# Patient Record
Sex: Female | Born: 1945 | Race: White | Hispanic: No | Marital: Married
Health system: Midwestern US, Community
[De-identification: ages and names within clinical notes are randomized; demographics above are authoritative.]

## PROBLEM LIST (undated history)

## (undated) DIAGNOSIS — Z96652 Presence of left artificial knee joint: Principal | ICD-10-CM

## (undated) DIAGNOSIS — Z96651 Presence of right artificial knee joint: Principal | ICD-10-CM

## (undated) DIAGNOSIS — M1711 Unilateral primary osteoarthritis, right knee: Principal | ICD-10-CM

---

## 2008-03-20 LAB — CBC WITH AUTOMATED DIFF
ABS. LYMPHOCYTES: 1.1 10*3/uL (ref 0.8–3.5)
ABS. MONOCYTES: 0.4 10*3/uL (ref 0–1.0)
ABS. NEUTROPHILS: 5.7 10*3/uL (ref 1.8–8.0)
BASOPHILS: 0 % (ref 0–3)
EOSINOPHILS: 0 % (ref 0–5)
HCT: 33.7 % — ABNORMAL LOW (ref 36.0–46.0)
HGB: 11.4 g/dL — ABNORMAL LOW (ref 12.0–16.0)
LYMPHOCYTES: 16 % — ABNORMAL LOW (ref 20–51)
MCH: 31.7 PG (ref 25.0–35.0)
MCHC: 34 g/dL (ref 31.0–37.0)
MCV: 93.3 FL (ref 78.0–102.0)
MONOCYTES: 5 % (ref 2–9)
MPV: 9 FL (ref 7.4–10.4)
NEUTROPHILS: 79 % — ABNORMAL HIGH (ref 42–75)
PLATELET: 186 10*3/uL (ref 130–400)
RBC: 3.61 M/uL — ABNORMAL LOW (ref 4.10–5.10)
RDW: 13.3 % (ref 11.5–14.5)
WBC: 7.2 10*3/uL (ref 4.5–13.0)

## 2008-03-20 LAB — TYPE & SCREEN
ABO/Rh(D): O NEG
Antibody screen: NEGATIVE

## 2008-03-20 LAB — METABOLIC PANEL, BASIC
Anion gap: 6 mmol/L (ref 5–15)
BUN/Creatinine ratio: 15 (ref 12–20)
BUN: 12 MG/DL (ref 7–18)
CO2: 28 MMOL/L (ref 21–32)
Calcium: 8.3 MG/DL — ABNORMAL LOW (ref 8.4–10.4)
Chloride: 105 MMOL/L (ref 100–108)
Creatinine: 0.8 MG/DL (ref 0.6–1.3)
GFR est AA: >60 mL/min/{1.73_m2} (ref >60)
GFR est non-AA: >60 mL/min/{1.73_m2} (ref >60)
Glucose: 121 MG/DL — ABNORMAL HIGH (ref 74–99)
Potassium: 3.7 MMOL/L (ref 3.5–5.5)
Sodium: 139 MMOL/L (ref 136–145)

## 2008-03-20 LAB — TYPE AND SCREEN
ABO/Rh: O NEG
Antibody Screen: NEGATIVE

## 2008-03-21 LAB — CBC WITH AUTOMATED DIFF
ABS. LYMPHOCYTES: 1.5 10*3/uL (ref 0.8–3.5)
ABS. MONOCYTES: 0.3 10*3/uL (ref 0–1.0)
ABS. NEUTROPHILS: 3.6 10*3/uL (ref 1.8–8.0)
BASOPHILS: 0 % (ref 0–3)
EOSINOPHILS: 1 % (ref 0–5)
HCT: 30.1 % — ABNORMAL LOW (ref 36.0–46.0)
HGB: 10.3 g/dL — ABNORMAL LOW (ref 12.0–16.0)
LYMPHOCYTES: 27 % (ref 20–51)
MCH: 31.9 PG (ref 25.0–35.0)
MCHC: 34.1 g/dL (ref 31.0–37.0)
MCV: 93.6 FL (ref 78.0–102.0)
MONOCYTES: 6 % (ref 2–9)
MPV: 9.7 FL (ref 7.4–10.4)
NEUTROPHILS: 66 % (ref 42–75)
PLATELET: 141 10*3/uL (ref 130–400)
RBC: 3.22 M/uL — ABNORMAL LOW (ref 4.10–5.10)
RDW: 13.2 % (ref 11.5–14.5)
WBC: 5.5 10*3/uL (ref 4.5–13.0)

## 2008-03-21 LAB — METABOLIC PANEL, BASIC
Anion gap: 6 mmol/L (ref 5–15)
BUN/Creatinine ratio: 13 (ref 12–20)
BUN: 10 MG/DL (ref 7–18)
CO2: 30 MMOL/L (ref 21–32)
Calcium: 7.8 MG/DL — ABNORMAL LOW (ref 8.4–10.4)
Chloride: 110 MMOL/L — ABNORMAL HIGH (ref 100–108)
Creatinine: 0.8 MG/DL (ref 0.6–1.3)
GFR est AA: >60 mL/min/{1.73_m2} (ref >60)
GFR est non-AA: >60 mL/min/{1.73_m2} (ref >60)
Glucose: 87 MG/DL (ref 74–99)
Potassium: 3.7 MMOL/L (ref 3.5–5.5)
Sodium: 146 MMOL/L — ABNORMAL HIGH (ref 136–145)

## 2009-07-17 ENCOUNTER — Emergency Department: Payer: Self-pay | Admitting: Emergency Medicine

## 2010-10-30 ENCOUNTER — Emergency Department: Payer: Self-pay | Admitting: Emergency Medicine

## 2011-04-20 ENCOUNTER — Inpatient Hospital Stay: Payer: Self-pay | Admitting: Specialist

## 2011-04-20 LAB — URINALYSIS, COMPLETE
Bacteria: NONE SEEN
Glucose,UR: NEGATIVE mg/dL (ref 0–75)
Nitrite: NEGATIVE
RBC,UR: <1 /HPF (ref 0–5)
Specific Gravity: 1.006 (ref 1.003–1.030)
WBC UR: 1 /HPF (ref 0–5)

## 2011-04-20 LAB — COMPREHENSIVE METABOLIC PANEL
Anion Gap: 12 (ref 7–16)
BUN: 9 mg/dL (ref 7–18)
Calcium, Total: 8.7 mg/dL (ref 8.5–10.1)
Chloride: 102 mmol/L (ref 98–107)
Co2: 24 mmol/L (ref 21–32)
EGFR (African American): >60
EGFR (Non-African Amer.): >60
Osmolality: 274 (ref 275–301)
Potassium: 4.1 mmol/L (ref 3.5–5.1)
Sodium: 138 mmol/L (ref 136–145)
Total Protein: 7 g/dL (ref 6.4–8.2)

## 2011-04-20 LAB — CBC
HCT: 40.5 % (ref 35.0–47.0)
HGB: 13.7 g/dL (ref 12.0–16.0)
MCH: 31.2 pg (ref 26.0–34.0)
MCV: 93 fL (ref 80–100)
Platelet: 135 10*3/uL — ABNORMAL LOW (ref 150–440)
RBC: 4.38 10*6/uL (ref 3.80–5.20)

## 2011-04-20 LAB — PRO B NATRIURETIC PEPTIDE: B-Type Natriuretic Peptide: 203 pg/mL — ABNORMAL HIGH (ref 0–125)

## 2011-04-20 LAB — TROPONIN I
Troponin-I: <0.02 ng/mL
Troponin-I: <0.02 ng/mL

## 2011-04-20 LAB — CK TOTAL AND CKMB (NOT AT ARMC): CK-MB: <0.5 ng/mL — ABNORMAL LOW (ref 0.5–3.6)

## 2011-04-20 LAB — RAPID INFLUENZA A&B ANTIGENS

## 2011-04-20 LAB — CK-MB: CK-MB: <0.5 ng/mL — ABNORMAL LOW (ref 0.5–3.6)

## 2011-04-21 LAB — CBC WITH DIFFERENTIAL/PLATELET
Basophil #: 0 10*3/uL (ref 0.0–0.1)
Eosinophil %: 0.1 %
HCT: 34.7 % — ABNORMAL LOW (ref 35.0–47.0)
HGB: 11.9 g/dL — ABNORMAL LOW (ref 12.0–16.0)
Lymphocyte #: 0.8 10*3/uL — ABNORMAL LOW (ref 1.0–3.6)
Lymphocyte %: 16.9 %
MCH: 31.5 pg (ref 26.0–34.0)
MCHC: 34.3 g/dL (ref 32.0–36.0)
Monocyte #: 0.4 10*3/uL (ref 0.0–0.7)
Monocyte %: 8.4 %
Neutrophil #: 3.4 10*3/uL (ref 1.4–6.5)
Neutrophil %: 74.3 %
Platelet: 121 10*3/uL — ABNORMAL LOW (ref 150–440)
RBC: 3.78 10*6/uL — ABNORMAL LOW (ref 3.80–5.20)
RDW: 12.8 % (ref 11.5–14.5)
WBC: 4.6 10*3/uL (ref 3.6–11.0)

## 2011-04-21 LAB — LIPID PANEL
Cholesterol: 180 mg/dL (ref 0–200)
Triglycerides: 71 mg/dL (ref 0–200)

## 2011-04-21 LAB — BASIC METABOLIC PANEL
Anion Gap: 10 (ref 7–16)
Calcium, Total: 7.9 mg/dL — ABNORMAL LOW (ref 8.5–10.1)
Co2: 24 mmol/L (ref 21–32)
Creatinine: 0.62 mg/dL (ref 0.60–1.30)
EGFR (African American): >60
EGFR (Non-African Amer.): >60
Glucose: 97 mg/dL (ref 65–99)
Osmolality: 280 (ref 275–301)
Potassium: 3.6 mmol/L (ref 3.5–5.1)

## 2011-04-21 LAB — CK-MB: CK-MB: <0.5 ng/mL — ABNORMAL LOW (ref 0.5–3.6)

## 2011-04-21 LAB — HEMOGLOBIN A1C: Hemoglobin A1C: 5.8 % (ref 4.2–6.3)

## 2011-04-21 LAB — TROPONIN I: Troponin-I: <0.02 ng/mL

## 2011-04-26 LAB — CULTURE, BLOOD (SINGLE)

## 2011-05-06 ENCOUNTER — Ambulatory Visit: Payer: Self-pay | Admitting: Internal Medicine

## 2011-08-18 ENCOUNTER — Ambulatory Visit: Payer: Self-pay | Admitting: Internal Medicine

## 2012-10-01 IMAGING — CR DG CHEST 2V
1 series · 3 of 3 positions shown · non-contrast
Comparison: none

REASON FOR EXAM: follow up pneumonia
COMMENTS:

PROCEDURE:     DXR - DXR CHEST PA (OR AP) AND LATERAL  - April 21, 2011  [DATE]
RESULT:
Comparison is made to a prior study dated 04/20/2011.

[Series 3: x chest ap · 0.14mm/px · 3 of 3 slices shown]
[im 1/3]
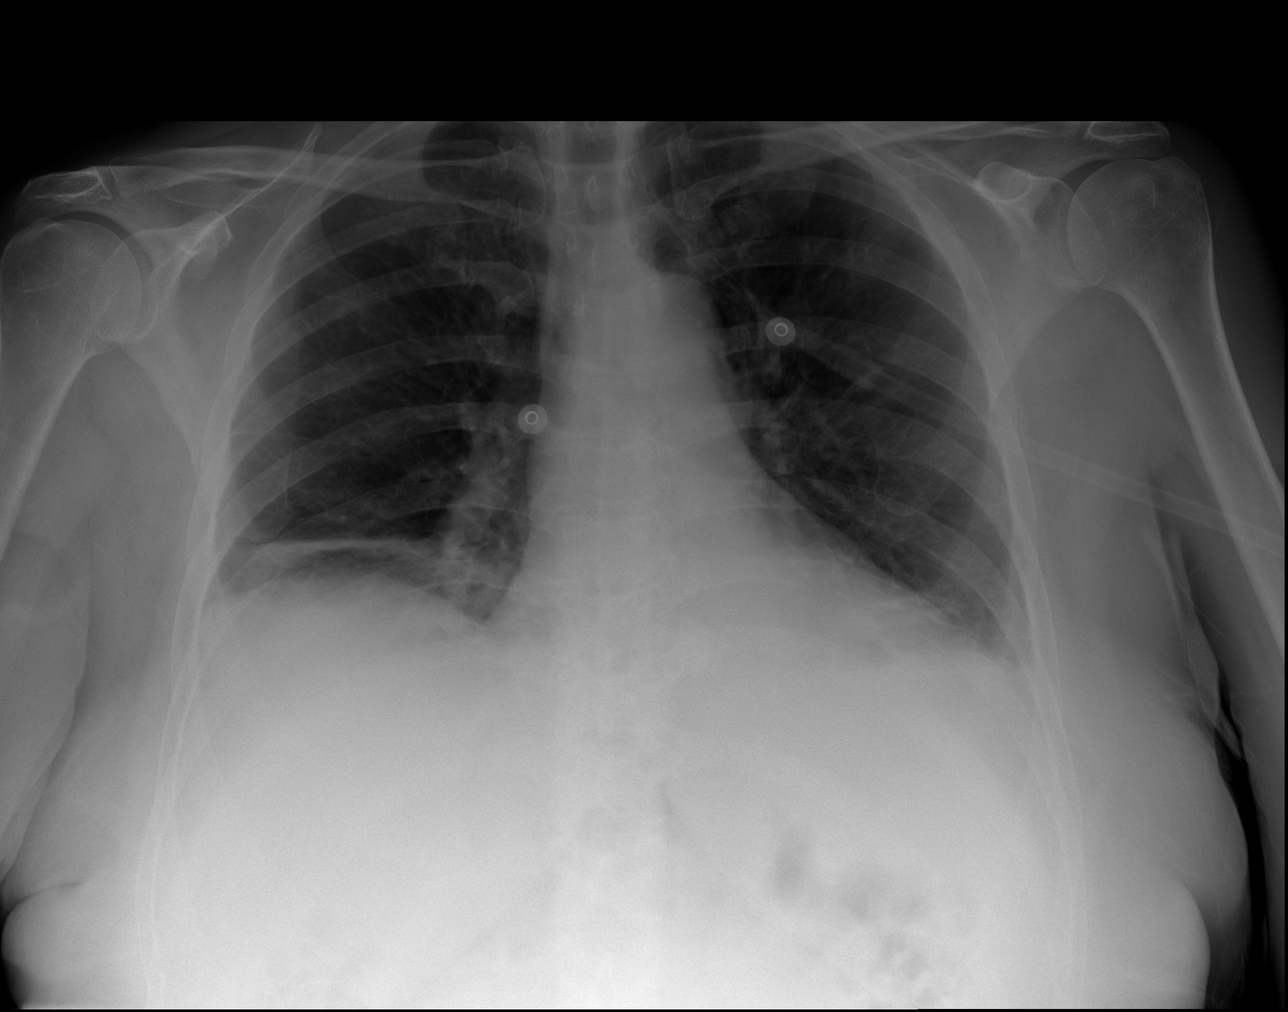
[im 2/3]
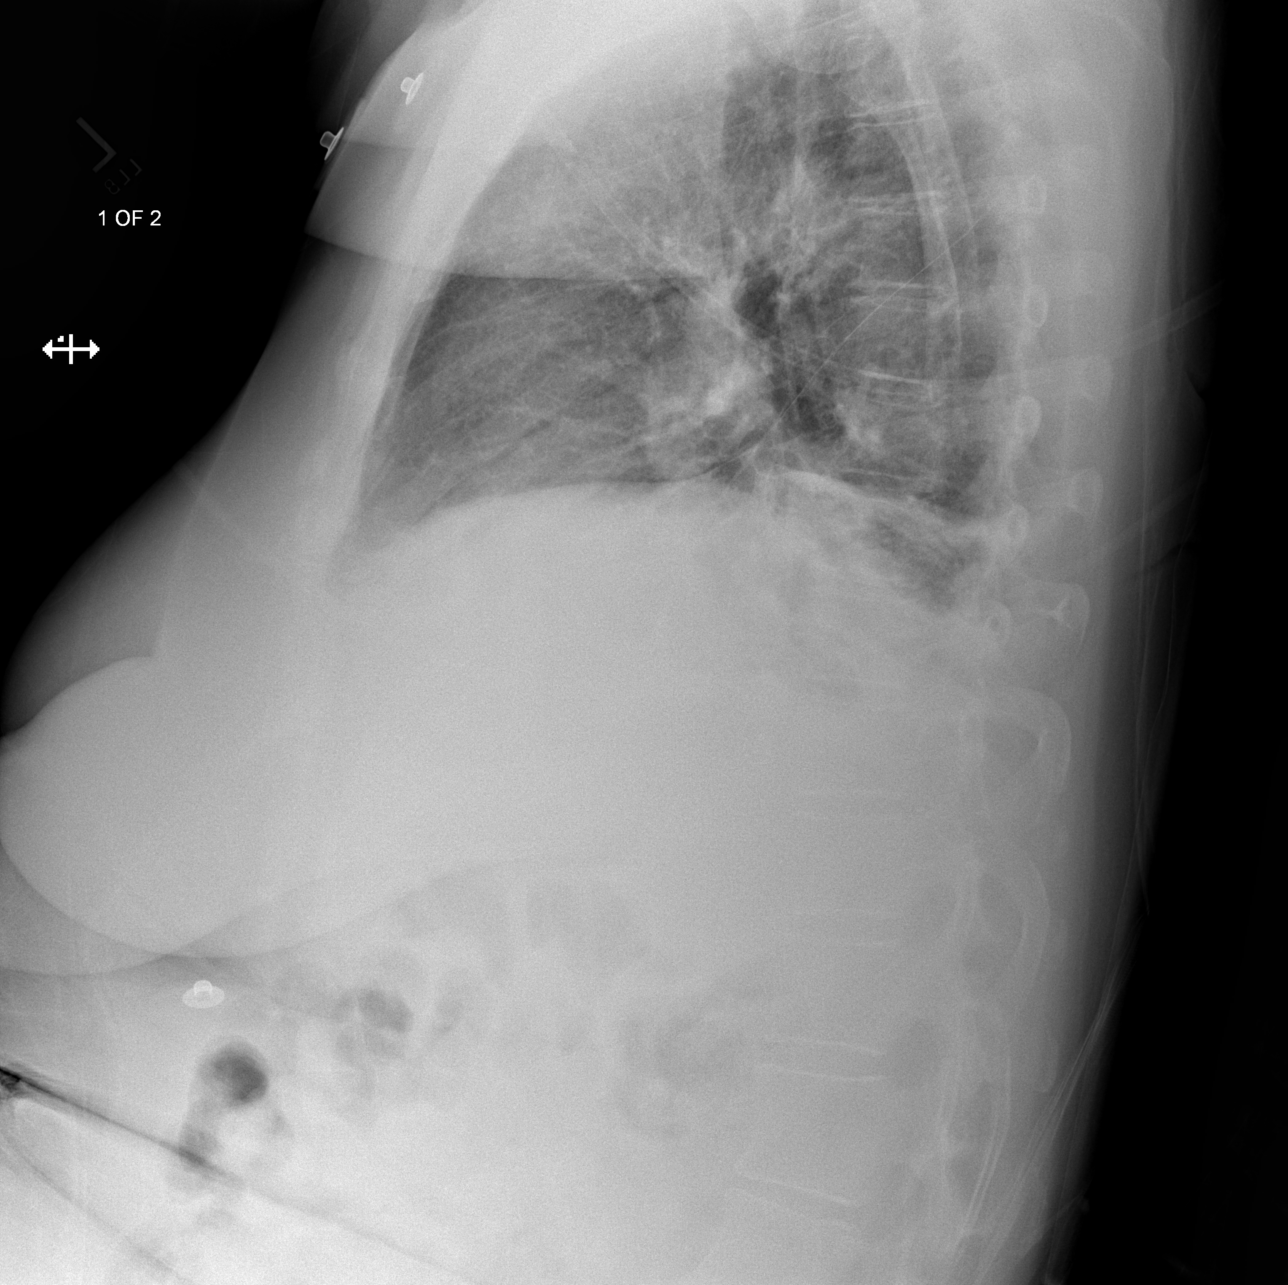
[im 3/3]
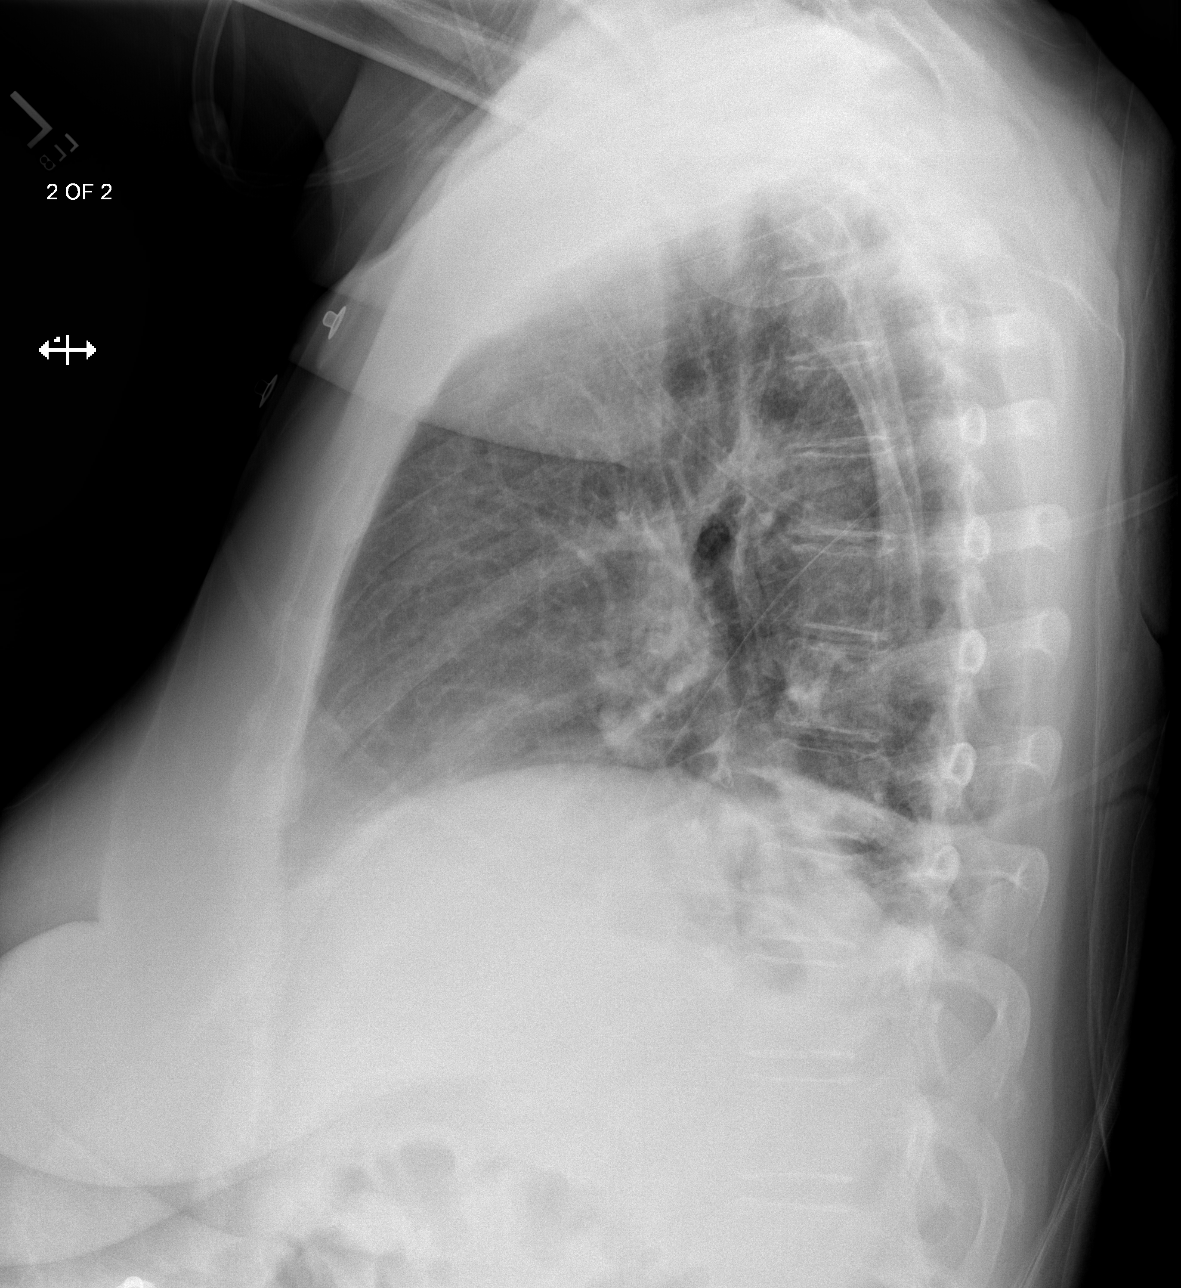

[3 of 3 positions shown; findings below may reference images not displayed]

FINDINGS: The patient has taken a shallow inspiration. Areas of increased
density project in the region of the lingula and right middle lobe. There is
blunting of the costophrenic angles. The cardiac silhouette is moderately
enlarged. The visualized bony skeleton is unremarkable.
IMPRESSION: 1.  Atelectasis versus infiltrate in the region of the lingula and right
middle lobe as well as likely discoid atelectasis in the region of the right
middle lobe.
2.  Likely small effusions.
3.  Findings are accentuated secondary to the patient's shallow inspiration.

## 2012-10-16 IMAGING — CR DG CHEST 2V
1 series · 2 of 2 positions shown · non-contrast
Comparison: none

REASON FOR EXAM: cough
COMMENTS:

[Series 1: pa · 0.17mm/px · 2 of 2 slices shown]
[im 1/2]
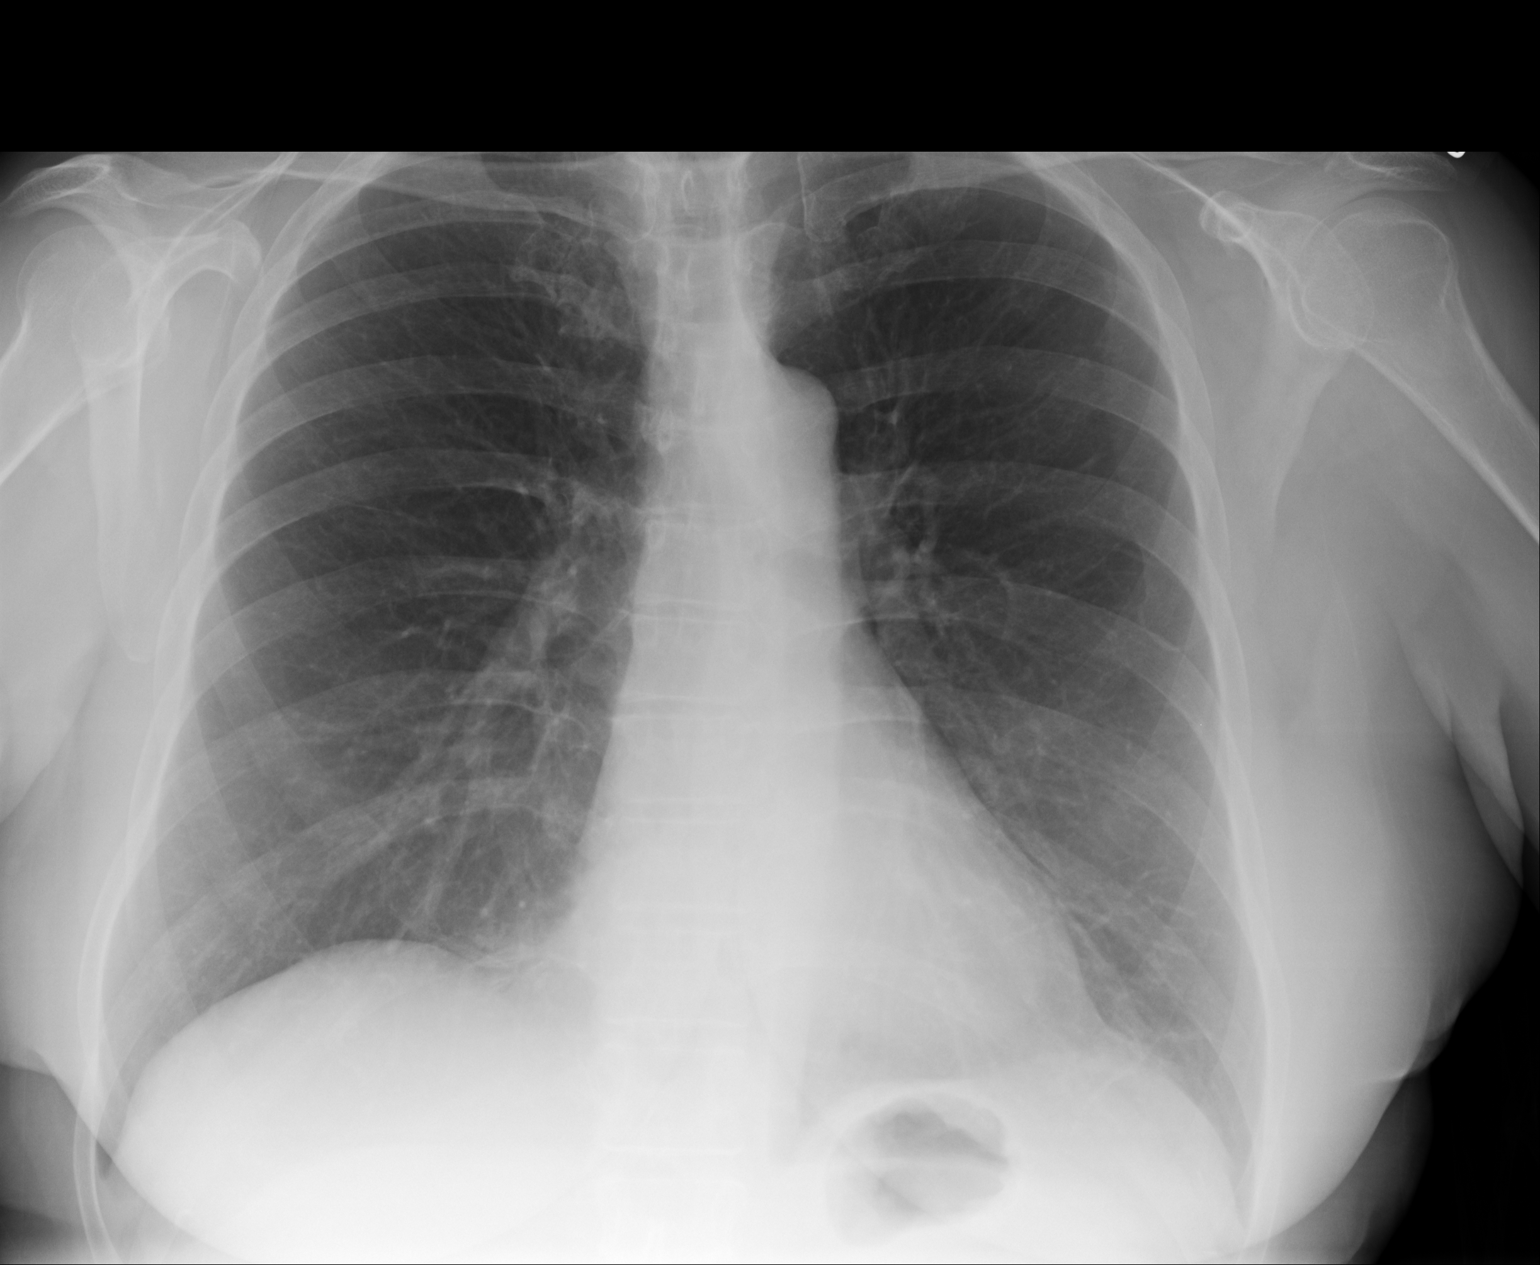
[im 2/2]
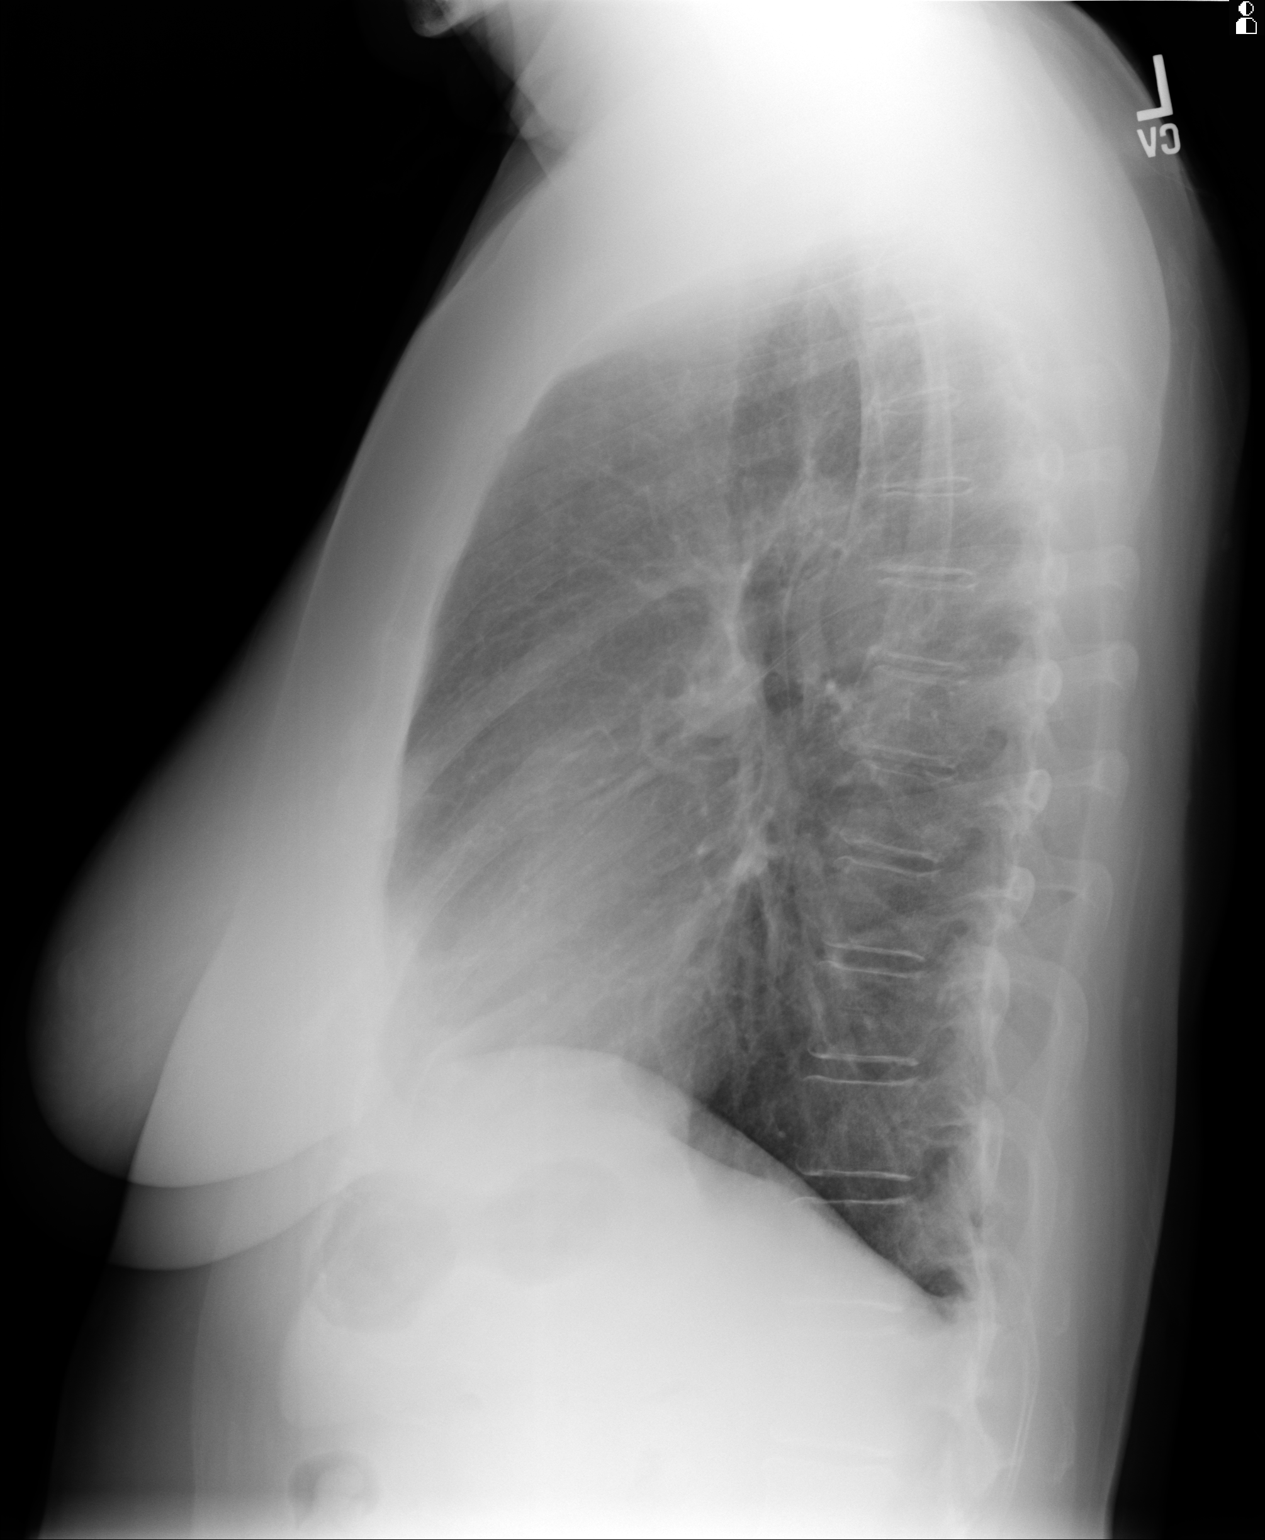

[2 of 2 positions shown; findings below may reference images not displayed]

PROCEDURE:     KDR - KDXR CHEST PA (OR AP) AND LAT  - May 06, 2011 [DATE]

RESULT:     Comparison is made to the prior exam of 04/21/2011. The
previously noted infiltrates or atelectasis in the lower lung fields
bilaterally have now cleared. The current exam shows the lung fields to be
clear. No pneumonia, pneumothorax or pleural effusion is seen.
IMPRESSION: 1. No significant abnormalities are noted.
2. Heart size is normal.
3. The previously noted densities projected over the lower lung fields
bilaterally have now cleared.

## 2013-01-28 IMAGING — US THYROID ULTRASOUND
1 series · 13 of 13 positions shown · non-contrast
Comparison: none

REASON FOR EXAM: Right neck mass
COMMENTS:

[Series 1: thyroid ultrasound · 0.07mm/px · 13 of 13 slices shown]
[im 1/13]
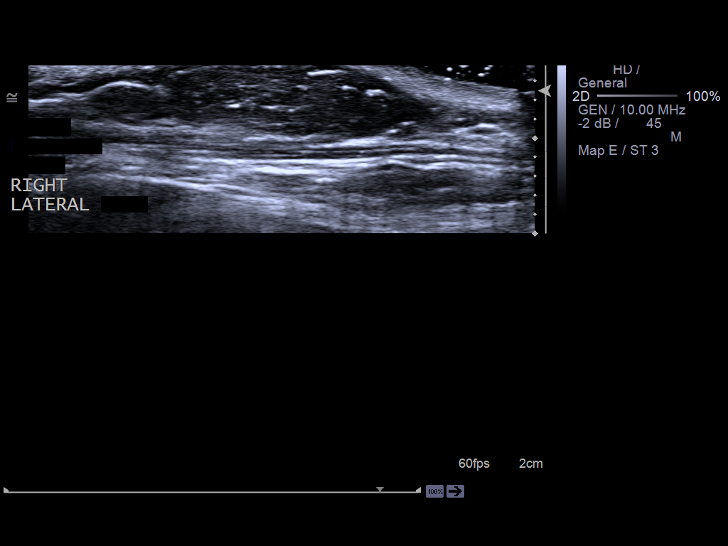
[im 2/13]
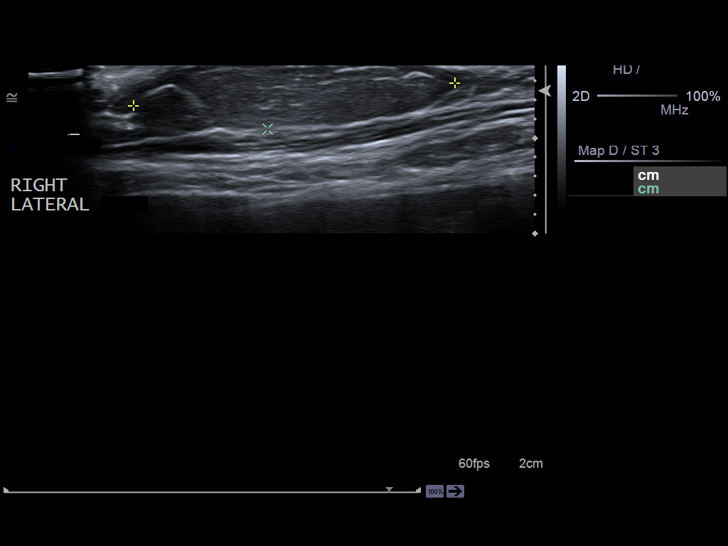
[im 3/13]
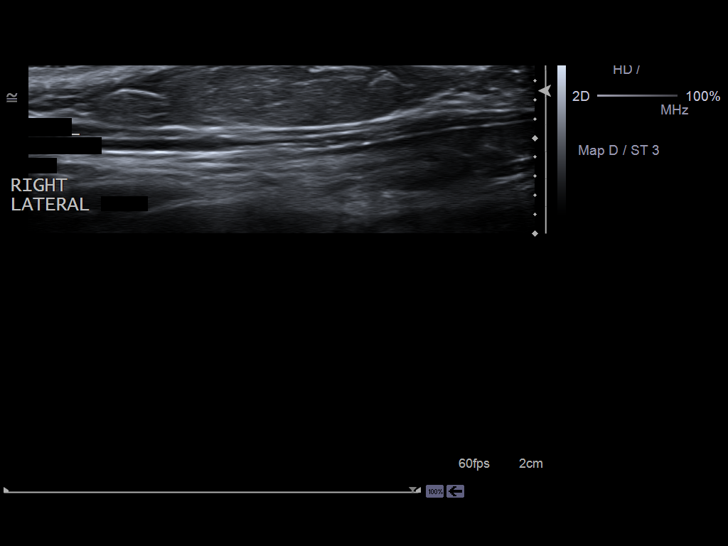
[im 4/13]
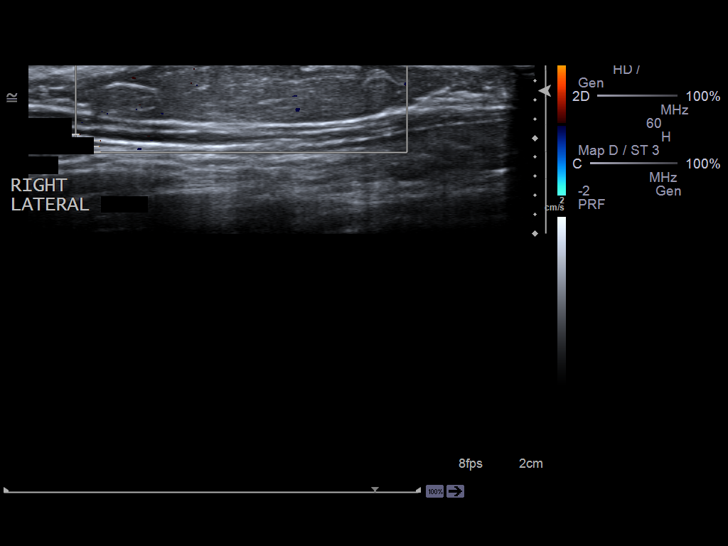
[im 5/13]
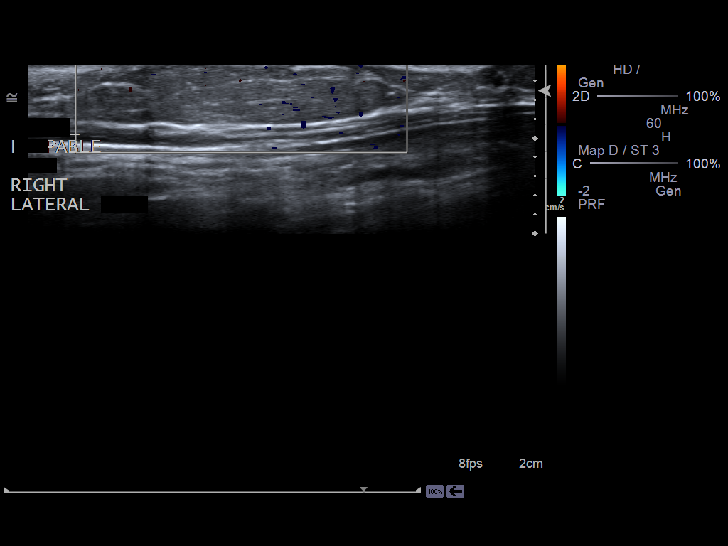
[im 6/13]
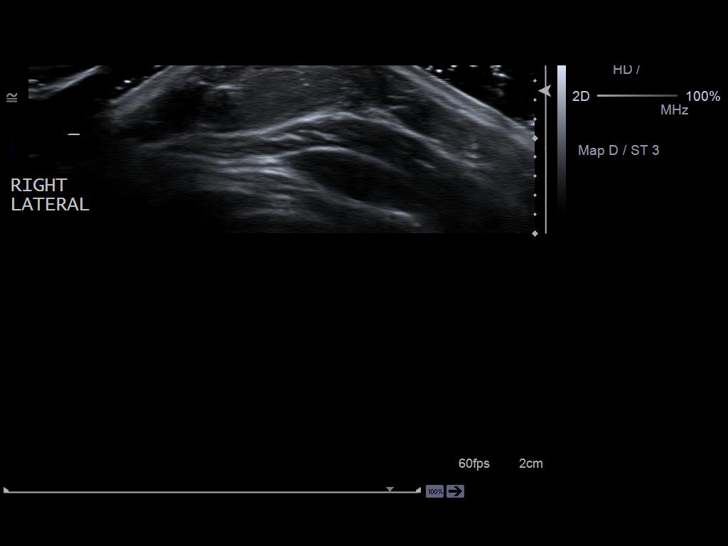
[im 7/13]
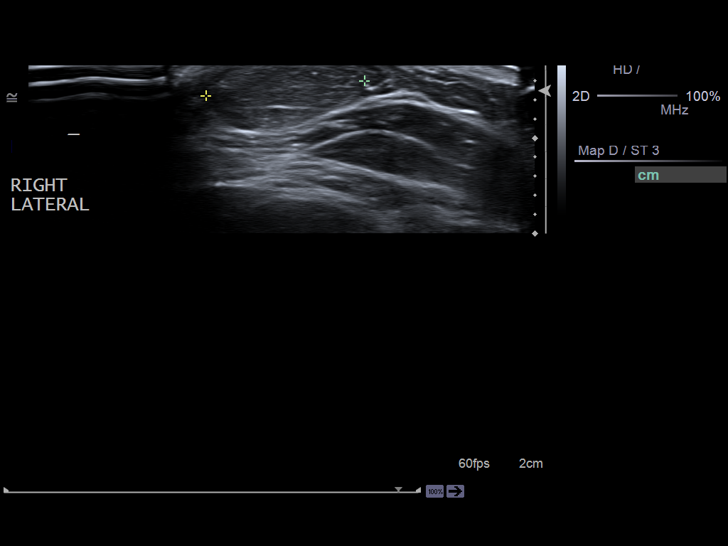
[im 8/13]
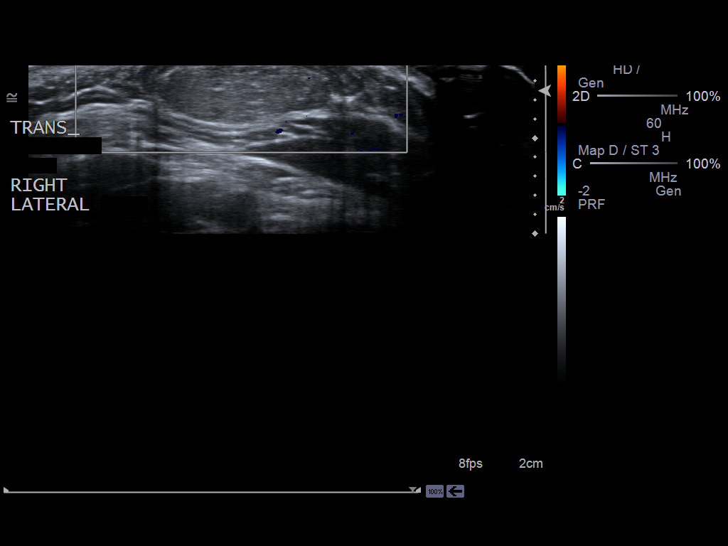
[im 9/13]
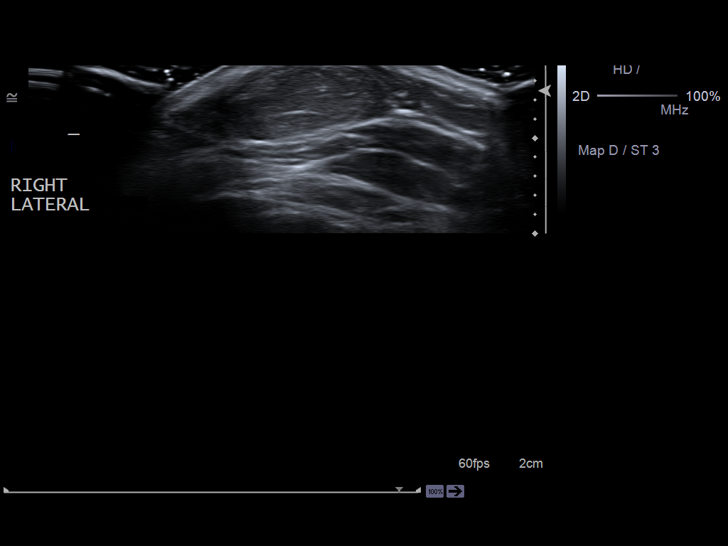
[im 10/13]
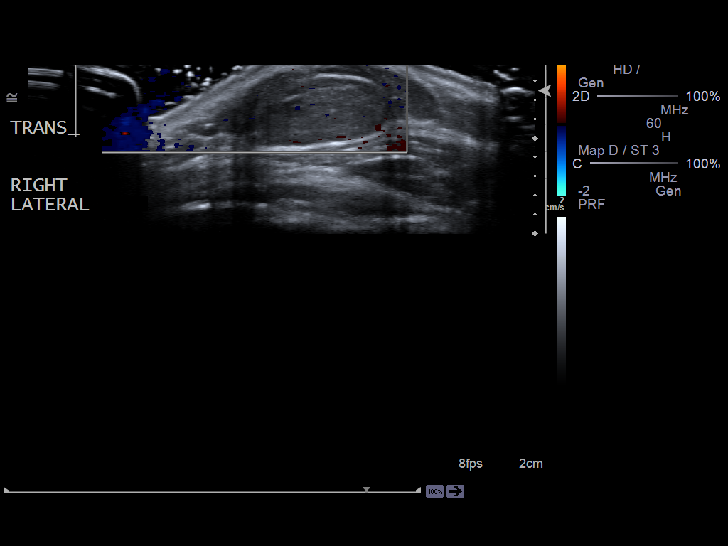
[im 11/13]
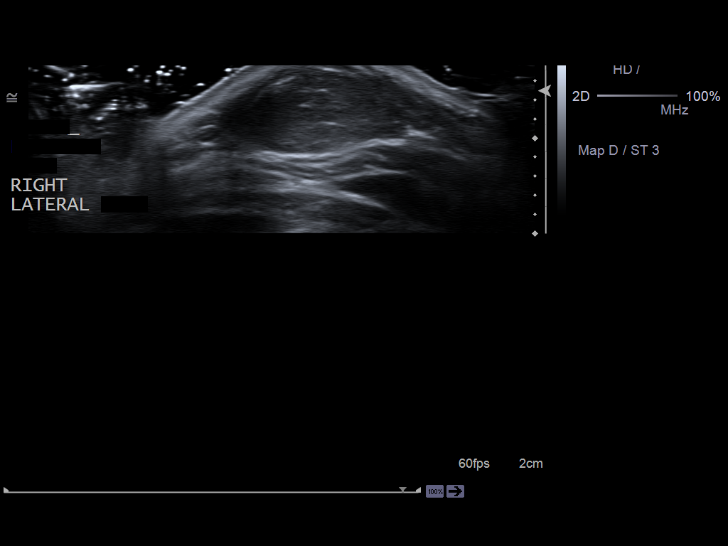
[im 12/13]
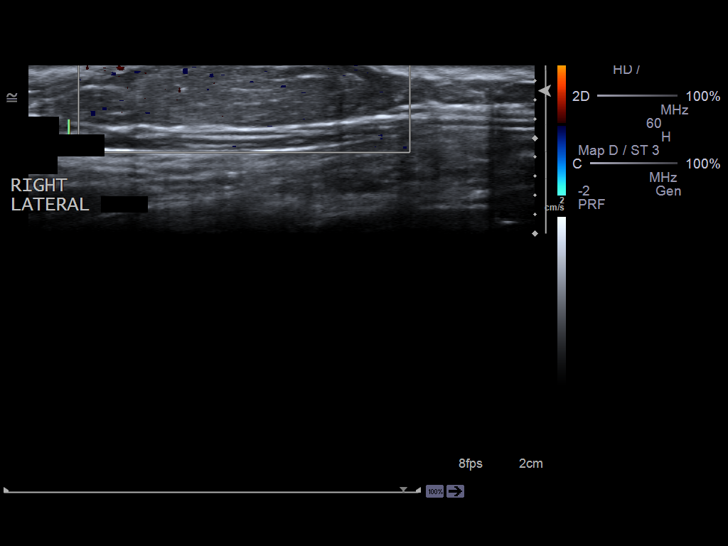
[im 13/13]
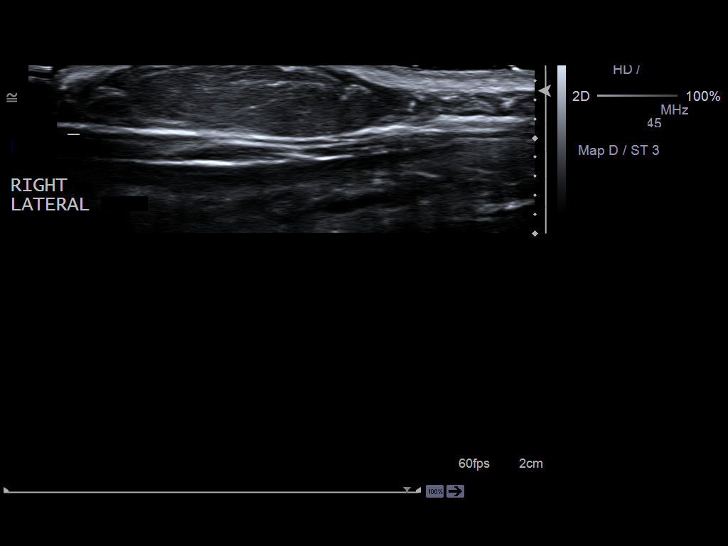

[13 of 13 positions shown; findings below may reference images not displayed]

PROCEDURE:     US  - US SOFT TISSUE HEAD/NECK  - August 18, 2011  [DATE]

RESULT:     The patient right lateral neck was evaluated. In the
subcutaneous tissues there is a slightly hypoechoic nonshadowing ovoid
structure measuring 3.4 x 0.8 I 1.7 cm. It does not appear to be a vascular
mass. Reportedly there is a history of a right neck mass for several years
which has been previously biopsied.
IMPRESSION: There is an ovoid slightly hypoechoic subcutaneous mass as
described. For further characterization, neck MRI may be the most useful
next step.

## 2014-07-23 NOTE — Discharge Summary (Signed)
PATIENT NAME:  Kendra Diaz, Kendra Diaz MR#:  [id number] DATE OF BIRTH:  July 29, 1945  DATE OF ADMISSION:  04/20/2011 DATE OF DISCHARGE:  04/23/2011  For a detailed note, please take a look at the history and physical done on admission by Dr. Enid Baasadhika Kalisetti.   DISCHARGE DIAGNOSES:  1. Pneumonia, likely community-acquired.  2. Shortness of breath and respiratory failure secondary to pneumonia.   DIET: The patient is being discharged on a regular diet.   ACTIVITY: As tolerated.   DISCHARGE FOLLOWUP: The patient is planning to get herself a primary care physician, once she qualifies for Medicare, coming up in the next week.   DISCHARGE MEDICATIONS:  1. Levaquin 750 mg daily x7 days.  2. Tussionex 5 mL p.o. twice a day p.r.n. cough.   PERTINENT STUDIES: Chest x-ray done on 04/21/2011 showed an infiltrate in the lingula and right middle lobe. Small pleural effusions.   The patient also had a two-dimensional echocardiogram done which showed the left atrium to be mildly dilated, ejection fraction to be 55%, and no other acute major valvular abnormalities.   HOSPITAL COURSE: This is a 69 year old female with medical problems as mentioned above who presented to the hospital due to shortness of breath, cough, nausea and vomiting.  1. Right lower lobe pneumonia: The patient presented to the hospital hypoxic and chest x-ray findings suggestive of a right middle lobe and lingula infiltrate. She was started on ceftriaxone and IV Levaquin and eventually switched to just Levaquin. She was also maintained on some oxygen. She continued to have a significant cough and was started on some antitussives and also given a flutter valve. Over the past few days, after getting IV antibiotic therapy, she has clinically improved. She still continues to have a cough, but it is much improved. She was ambulated on room air and did not have any further desaturations or hypoxemia; therefore, she is being discharged home on some p.o.  Levaquin. She also has been given some Tussionex for her cough. The patient remained afebrile and her blood cultures were negative. She was unable to give us a sputum sample.  2. Pleural effusions: The patient was noted to have tiny, small pleural effusions and therefore there was some concern for congestive heart failure. She had a BNP of 203 on admission. She underwent an echocardiogram which showed no evidence of any valvular abnormalities and normal ejection fraction. The patient does not have a primary care physician as she currently does not have any routine medical problems. We offered to schedule an appointment with her primary care physician, but she prefers to find herself a doctor once her Medicare is active, which will likely happen in the month of February.   TIME SPENT: 40 minutes. ____________________________ Rolly PancakeVivek J. Cherlynn KaiserSainani, MD vjs:slb D: 04/23/2011 15:59:26 ET T: 04/24/2011 11:54:53 ET JOB#: [id number]  cc: Rolly PancakeVivek J. Cherlynn KaiserSainani, MD, <Dictator> Houston SirenVIVEK J Bram Hottel MD ELECTRONICALLY SIGNED 04/24/2011 15:42

## 2014-07-23 NOTE — H&P (Signed)
PATIENT NAME:  Kendra Diaz, Kendra Diaz MR#:  [id number] DATE OF BIRTH:  1945/06/10  DATE OF ADMISSION:  04/20/2011  ADMITTING PHYSICIAN: Enid Baas, MD   PRIMARY CARE PHYSICIAN: None.   CHIEF COMPLAINT: Difficulty breathing and also cough.   HISTORY OF PRESENT ILLNESS: Ms. Kendra Diaz is a 69 year old Caucasian female with no significant past medical history who presented to the hospital complaining of 3 to 4 day history of worsening cough and also dyspnea. The patient also complains of some cold chills at home but was not able to take her temperature. Her cough has been mostly dry though she felt congested. She did not take the flu shot this year. She has been having body aches, headache, nausea associated with vomiting about five times this morning. Her flu test has been negative in the Emergency Room. Chest x-ray showed right lower lobe and lingular infiltrates and she was hypoxic with 89% on room air at presentation. She is being admitted for acute hypoxic respiratory failure secondary to pneumonia.   PAST MEDICAL HISTORY: None except history of teratoma in the past.   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  MEDICATIONS AT HOME: None.   ALLERGIES TO MEDICATIONS: She is allergic to an antibiotic, non penicillin type but not sure what the name of it is.   SOCIAL HISTORY: Lives at home with husband. No smoking or alcohol use.   FAMILY HISTORY: Both parents healthy. No medical problems running in the family except borderline diabetes in brother.   REVIEW OF SYSTEMS: CONSTITUTIONAL: Positive for fever, fatigue, weakness. EYES: No blurred vision, double vision, inflammation, or glaucoma. ENT: No tinnitus, ear pain, epistaxis, hearing loss, or discharge. RESPIRATORY: Positive for cough. No history of wheeze, chronic obstructive pulmonary disease. Positive for dyspnea. CARDIOVASCULAR: No chest pain, orthopnea, edema, arrhythmia,  palpitations, or syncope. GI: Positive for nausea, vomiting. No diarrhea. No abdominal pain, hematemesis, or melena. GU: No dysuria. Decreased frequency of urination. No hematuria or renal calculus. ENDOCRINE: No polyuria, nocturia, thyroid problems, heat or cold intolerance. HEMATOLOGY: No anemia, easy bruising or bleeding. SKIN: No acne, rash, or lesions. MUSCULOSKELETAL: No neck, back, shoulder pain, arthritis, or gout. NEUROLOGIC: No numbness, weakness, cerebrovascular accident, transient ischemic attack, or seizures. PSYCHOLOGICAL: No anxiety, insomnia, or depression.   PHYSICAL EXAMINATION:   VITAL SIGNS: Temperature 99.6 degrees Fahrenheit, pulse 86, respirations 23, blood pressure 149/67, pulse oximetry 89% on room air.   GENERAL: Well built, well nourished female lying in bed not in any acute distress.   HEENT: Normocephalic, atraumatic. Pupils equal, round, reacting to light. Anicteric sclerae. Extraocular movements intact. Oropharynx clear. Very dry appearing tongue. No erythema, mass, or exudates.   NECK: Supple. No thyromegaly, JVD, or carotid bruits. No lymphadenopathy.   LUNGS: Moving air bilaterally. Decreased breath sounds with rhonchi at right lung base posteriorly significant. No use of accessory muscles for breathing.   CARDIOVASCULAR: S1, S2 regular rate and rhythm. No murmurs, rubs, or gallops.   ABDOMEN: Soft, nontender, nondistended. No hepatosplenomegaly. Normal bowel sounds.   EXTREMITIES: No pedal edema. No clubbing or cyanosis. 2+ dorsalis pedis pulses palpable bilaterally.   SKIN: No acne, rash, or lesions.   LYMPHATICS: No cervical lymphadenopathy.   NEUROLOGIC: Cranial nerves intact. No focal motor or sensory deficits.   PSYCHOLOGICAL: The patient is awake, alert, oriented x3.   LABORATORY, DIAGNOSTIC, AND RADIOLOGICAL DATA: WBC 6.7, hemoglobin 13.7, hematocrit 40.5, platelet count 135, sodium 138, potassium 4.1, chloride 102, bicarb 24, BUN 9, creatinine  0.56, glucose 99, calcium 8.7, ALT 25, AST 26, alkaline phosphatase 78, total bilirubin 0.5, albumin 3.9. Troponin less than 0.02. BNP 203. CK 91. CK-MB less than 0.5. Influenza A and B antigens are negative. Chest x-ray showing right lower lobe and lingular infiltrates, prominence of interstitial markings and component of peribronchial cuffing is present, mild pulmonary edema also of diagnostic consideration. EKG showing normal sinus rhythm, heart rate of 83 with some PACs present. Underlying atrial fibrillation cannot be ruled out completely.   ASSESSMENT AND PLAN: This is a 69 year old female with no significant past medical history admitted for right-sided pneumonia and possible viral illness.  1. Right lower lobe pneumonia. Blood cultures have been sent. Will start on Rocephin and Levaquin. She was hypoxic on room air on presentation so will continue oxygen support. Continue IV fluids and monitor.  2. Possible underlying viral illness with nausea, vomiting, and body aches. Flu test is negative but the sensitivity is only 69%. Continue supportive treatment with fluids, Zofran, and Phenergan. Full liquid diet for now and advance diet as tolerated.  3. Mild pulmonary edema seen on chest x-ray, possibly could be pneumonitis. No history of congestive heart failure. No cardiomegaly. She appears clinically very dry so will continue with IV fluids and get echocardiogram in a.m. for systolic function. Repeat PA and lateral chest x-ray in the morning.  4. GI and DVT prophylaxis. On Protonix and TED stockings. Ambulate as tolerated.  CODE STATUS: FULL CODE.   TIME SPENT ON ADMISSION: 50 minutes.   ____________________________ Enid Baasadhika Calub Tarnow, MD rk:drc D: 04/20/2011 19:13:30 ET T: 04/21/2011 09:23:21 ET JOB#: [id number]  cc: Enid Baasadhika Ritesh Opara, MD, <Dictator> Enid BaasADHIKA Mariellen Blaney MD ELECTRONICALLY SIGNED 04/25/2011 14:45

## 2020-04-28 LAB — FECAL DNA COLORECTAL CANCER SCREENING (COLOGUARD)

## 2021-01-20 LAB — MICROSCOPIC URINALYSIS: Amorphous, UA: NONE SEEN /HPF

## 2021-01-20 LAB — URINALYSIS W/ RFLX MICROSCOPIC
Glucose, UA: 100 mg/dL — AB
Nitrite, Urine: POSITIVE — AB
Protein, UA: 100 — AB
Specific Gravity, UA: 1.025 (ref 1.003–1.035)
Urobilinogen, Urine: >=8 EU/dL — AB
pH, UA: 5 (ref 4.5–8.0)

## 2021-01-20 NOTE — ED Notes (Signed)
ED Triage Note       ED Secondary Triage Entered On:  01/20/2021 3:43 EDT    Performed On:  01/20/2021 3:41 EDT by Chauncey Reading A-RN               General Information   Barriers to Learning :   None evident   Influenza Vaccine Status :   Not received   COVID-19 Vaccine Status :   Not received   ED Home Meds Section :   Document assessment   [facility] ED Fall Risk Section :   Document assessment   ED Advance Directives Section :   Document assessment   ED Palliative Screen :   Document assessment   Chauncey Reading A-RN - 01/20/2021 3:41 EDT   (As Of: 01/20/2021 03:43:27 EDT)   Problems(Active)    Anxiety (SNOMED CT  :10626948 )  Name of Problem:   Anxiety ; Recorder:   Chauncey Reading A-RN; Confirmation:   Confirmed ; Classification:   Patient Stated ; Code:   [id number] ; Contributor System:   PowerChart ; Last Updated:   01/20/2021 3:40 EDT ; Life Cycle Date:   01/20/2021 ; Life Cycle Status:   Active ; Vocabulary:   SNOMED CT        High cholesterol (SNOMED CT  :00938182 )  Name of Problem:   High cholesterol ; Recorder:   Chauncey Reading A-RN; Confirmation:   Confirmed ; Classification:   Patient Stated ; Code:   [id number] ; Contributor System:   Dietitian ; Last Updated:   01/20/2021 3:40 EDT ; Life Cycle Date:   01/20/2021 ; Life Cycle Status:   Active ; Vocabulary:   SNOMED CT          Diagnoses(Active)    Dysuria  Date:   01/20/2021 ; Diagnosis Type:   Reason For Visit ; Confirmation:   Complaint of ; Clinical Dx:   Dysuria ; Classification:   Medical ; Clinical Service:   Emergency medicine ; Code:   PNED ; Probability:   0 ; Diagnosis Code:   [id number]             -    Procedure History   (As Of: 01/20/2021 03:43:27 EDT)     Anesthesia Minutes:   0 ; Procedure Name:   Wrist ; Procedure Minutes:   0 ; Last Reviewed Dt/Tm:   01/20/2021 03:43:19 EDT            Anesthesia Minutes:   0 ; Procedure Name:   Hysterectomy ; Procedure Minutes:   0 ; Last Reviewed  Dt/Tm:   01/20/2021 03:42:55 EDT            Anesthesia Minutes:   0 ; Procedure Name:   Knee ; Procedure Minutes:   0 ; Last Reviewed Dt/Tm:   01/20/2021 03:43:11 EDT            [facility] Fall Risk Assessment Tool   Hx of falling last 3 months ED Fall :   No   Patient confused or disoriented ED Fall :   No   Patient intoxicated or sedated ED Fall :   No   Patient impaired gait ED Fall :   No   Use a mobility assistance device ED Fall :   No   Patient altered elimination ED Fall :   No   [facility] ED Fall Score :   0    Chauncey Reading A-RN - 01/20/2021 3:41  EDT   ED Advance Directive   Advance Directive :   No   Chauncey Reading A-RN - 01/20/2021 3:41 EDT   Palliative Care   Does the Patient have a Life Limiting Illness :   None of the above   Chauncey Reading A-RN - 01/20/2021 3:41 EDT   Med Hx   Medication List   (As Of: 01/20/2021 03:43:27 EDT)   Home Meds    ALPRAZolam  :   ALPRAZolam ; Status:   Documented ; Ordered As Mnemonic:   ALPRAZolam 0.25 mg oral tablet ; Simple Display Line:   0.25 mg, 1 tabs, Oral, TID, PRN: for anxiety, 0 Refill(s) ; Catalog Code:   ALPRAZolam ; Order Dt/Tm:   01/20/2021 03:42:25 EDT          traMADol  :   traMADol ; Status:   Documented ; Ordered As Mnemonic:   traMADol 50 mg oral tablet ; Simple Display Line:   50 mg, 1 tabs, Oral, q6hr, PRN, 30 EA, 1 Refill(s) ; Catalog Code:   traMADol ; Order Dt/Tm:   01/20/2021 03:42:25 EDT

## 2021-01-20 NOTE — ED Notes (Signed)
 ED Patient Summary       ;       [facility]  [address], [place] [address]  [phone]  Discharge Instructions (Patient)  Name: Sandra Christensen, Sandra Christensen  DOB:  November 15, 1945                   MRN: [id number]                   FIN: [id number]  Reason For Visit: Dysuria; UTI SX  Final Diagnosis: UTI (urinary tract infection)     Visit Date: 01/20/2021 03:13:00  Address: [address] [place] [address]  Phone: [phone]     Emergency Department Providers:         Primary Physician:      PANDORA LUPITA ADE      [facility] would like to thank you for allowing us  to assist you with your healthcare needs. The following includes patient education materials and information regarding your injury/illness.     Follow-up Instructions:  You were seen today on an emergency basis. Please contact your primary care doctor for a follow up appointment. If you received a referral to a specialist doctor, it is important you follow-up as instructed.    It is important that you call your follow-up doctor to schedule and confirm the location of your next appointment. Your doctor may practice at multiple locations. The office location of your follow-up appointment may be different to the one written on your discharge instructions.    If you do not have a primary care doctor, please call [phone] for help in finding a [name] Provider. For help in finding a specialist doctor, please call [phone].    If your condition gets worse before your follow-up with your primary care doctor or specialist, please return to the Emergency Department.      Coronavirus 2019 (COVID-19) Reminders:     Patients age 45 - 84, with parental consent, and patients over age 15 can make an appointment for a COVID-19 vaccine. Patients can contact their [facility] doctors' offices to schedule an appointment to receive the COVID-19 vaccine.  Patients who do not have a [facility] physician can call [phone] to schedule vaccination appointments.      Follow Up Appointments:  Primary Care Provider:     Name: PCP,  NONE     Phone:                   With: Address: When:   Follow up with primary care provider  Within 2 to 4 days              Post [facility] SERVICES%>             New Medications  Printed Prescriptions  cephalexin (cephalexin 500 mg oral capsule) 1 Capsules Oral (given by mouth) 4 times a day for 10 Days. Refills: 0.  Last Dose:____________________  phenazopyridine (Pyridium 200 mg oral tablet) 1 Tabs Oral (given by mouth) 3 times a day for 3 Days. Refills: 0.  Last Dose:____________________  Medications that have not changed  Other Medications  ALPRAZolam (ALPRAZolam 0.25 mg oral tablet) 1 Tabs Oral (given by mouth) 3 times a day as needed for anxiety.  Last Dose:____________________  traMADol (traMADol 50 mg oral tablet) 1 Tabs Oral (given by mouth) every 6 hours as needed.  Last Dose:____________________      Allergy Info: Levaquin  Discharge Additional Information    Patient Education Materials:        Urinary Tract Infection, Adult      A urinary tract infection (UTI) is an infection of any part of the urinary tract. The urinary tract includes:   The kidneys.     The ureters.     The bladder.     The urethra.      These organs make, store, and get rid of pee (urine) in the body.      What are the causes?    This infection is caused by germs (bacteria) in your genital area. These germs grow and cause swelling (inflammation) of your urinary tract.      What increases the risk?    The following factors may make you more likely to develop this condition:   Using a small, thin tube (catheter) to drain pee.     Not being able to control when you pee or poop (incontinence).     Being female. If you are female, these things can increase the risk:  ? Using these methods to prevent pregnancy:  ? A medicine that kills  sperm (spermicide).    ? A device that blocks sperm (diaphragm).      ? Having low levels of a female hormone (estrogen).    ? Being pregnant.        You are more likely to develop this condition if:   You have genes that add to your risk.     You are sexually active.     You take antibiotic medicines.      You have trouble peeing because of:  ? A prostate that is bigger than normal, if you are female.    ? A blockage in the part of your body that drains pee from the bladder.    ? A kidney stone.     ? A nerve condition that affects your bladder.    ? Not getting enough to drink.     ? Not peeing often enough.       You have other conditions, such as:  ? Diabetes.     ? A weak disease-fighting system (immune system).    ? Sickle cell disease.     ? Gout.     ? Injury of the spine.          What are the signs or symptoms?    Symptoms of this condition include:   Needing to pee right away.     Peeing small amounts often.     Pain or burning when peeing.     Blood in the pee.     Pee that smells bad or not like normal.     Trouble peeing.     Pee that is cloudy.     Fluid coming from the vagina, if you are female.     Pain in the belly or lower back.      Other symptoms include:   Vomiting.     Not feeling hungry.     Feeling mixed up (confused). This may be the first symptom in older adults.     Being tired and grouchy (irritable).     A fever.     Watery poop (diarrhea).        How is this treated?     Taking antibiotic medicine.     Taking other medicines.  Drinking enough water.    In some cases, you may need to see a specialist.      Follow these instructions at home:      Medicines     Take over-the-counter and prescription medicines only as told by your doctor.     If you were prescribed an antibiotic medicine, take it as told by your doctor. Do not stop taking it even if you start to feel better.      General instructions     Make sure you:  ? Pee until your bladder is empty.     ? Do not hold pee for a long  time.    ? Empty your bladder after sex.    ? Wipe from front to back after peeing or pooping if you are a female. Use each tissue one time when you wipe.       Drink enough fluid to keep your pee pale yellow.     Keep all follow-up visits.        Contact a doctor if:     You do not get better after 1?2 days.     Your symptoms go away and then come back.      Get help right away if:     You have very bad back pain.     You have very bad pain in your lower belly.     You have a fever.     You have chills.     You feeling like you will vomit or you vomit.      Summary     A urinary tract infection (UTI) is an infection of any part of the urinary tract.     This condition is caused by germs in your genital area.     There are many risk factors for a UTI.     Treatment includes antibiotic medicines.     Drink enough fluid to keep your pee pale yellow.      This information is not intended to replace advice given to you by your health care provider. Make sure you discuss any questions you have with your health care provider.      Document Revised: 10/28/2019 Document Reviewed: 10/28/2019  Elsevier Patient Education ? 2021 Elsevier Inc.      ---------------------------------------------------------------------------------------------------------------------  [facility] allows patients to review your COVID and other test results as well as discharge documents from any [name], Emergency Department, surgical center or outpatient lab. Test results are typically available 36 hours after the test is completed.     [facility] encourages you to self-enroll in the [facility] Patient Portal.     To begin your self-enrollment process, please visit [email or web address] Under [facility], click on "Sign up now".     NOTE: You must be 16 years and older to use [facility] Self-Enroll online. If you are a parent, caregiver, or guardian; you need an invite to  access your child's or dependent's health records. To obtain an invite, contact the Medical Records department at [phone] Monday through Friday, 8-4:30, select option 3 . If we receive your call afterhours, we will return your call the next business day.     If you have issues trying to create or access your account, contact [employer] support at [phone] available 7 days a week 24 hours a day.     Comment:

## 2021-01-20 NOTE — Discharge Summary (Signed)
ED Clinical Summary                      [facility]  [address]  [place], [place], [address]  [phone]           PERSON INFORMATION  Name: Sandra Christensen, Sandra Christensen Age:  75 Years DOB: [birth year]   Sex: Female Language: English PCP: PCP,  NONE   Marital Status:  Married Phone: [phone] Med Service: Christiana Pellant   MRN:  [id number] Acct# [id number] Arrival: 01/20/2021 03:13:00   Visit Reason: Dysuria; UTI SX Acuity: 3 LOS: 000 00:48   Address:      [address] [place] [address]  Diagnosis:      UTI (urinary tract infection)  Printed Prescriptions:            Allergies      Levaquin (sick)      Medications Administered During Visit:                  Medication Dose Route   cephalexin 500 mg Oral   phenazopyridine 200 mg Oral       Patient Medication List:              ALPRAZolam (ALPRAZolam 0.25 mg oral tablet) 1 Tabs Oral (given by mouth) 3 times a day as needed for anxiety.  cephalexin (cephalexin 500 mg oral capsule) 1 Capsules Oral (given by mouth) 4 times a day for 10 Days. Refills: 0.  phenazopyridine (Pyridium 200 mg oral tablet) 1 Tabs Oral (given by mouth) 3 times a day for 3 Days. Refills: 0.  traMADol (traMADol 50 mg oral tablet) 1 Tabs Oral (given by mouth) every 6 hours as needed.       Major Tests and Procedures:  The following procedures and tests were performed during your ED visit.  COMMONPROCEDURES%>  COMMON PROCEDURESCOMMENTS%>          Laboratory Orders  Name Status Details   .UA Micro Completed Urine, Clean Catch, Stat, ST - Stat, Collected, 01/20/21 3:27:00 EDT, Once, 01/20/21 3:27:00 EDT, Nurse collect, 01/20/21 3:27:00 EDT, Angus Palms RICHARD-MD, Print label Y/N, [id number]   C Urine Ordered Urine, Clean Catch, Stat, ST - Stat, 01/20/21 3:25:00 EDT, Once, 01/20/21 3:25:00 EDT, Nurse collect, Print label Y/N   UA Rflx Mic Completed Urine, Clean Catch, Stat, ST - Stat, 01/20/21 3:25:00 EDT, Once, 01/20/21 3:25:00 EDT, Nurse  collect, POTTS,  CARL RICHARD-MD, Print label Y/N               Radiology Orders  No radiology orders were placed.              Patient Care Orders  Name Status Details   ED Assessment Adult Completed 01/20/21 3:41:49 EDT, 01/20/21 3:41:49 EDT   ED Secondary Triage Completed 01/20/21 3:41:50 EDT, 01/20/21 3:41:50 EDT   ED Triage Adult Completed 01/20/21 3:13:34 EDT, 01/20/21 3:13:34 EDT           PROVIDER INFORMATION              Provider Role Assigned Lysle Morales A-RN ED Nurse 01/20/2021 03:21:32    Angus Palms RICHARD-MD ED Provider 01/20/2021 03:22:33        Attending Physician:  Angus Palms RICHARD-MD    Admit Doc  Angus Palms RICHARD-MD   Consulting Doc       VITALS INFORMATION  Vital Sign Triage Latest  Temp Oral ORAL_1%>36.5 degC ORAL%>36.5 degC   Temp Temporal TEMPORAL_1%> TEMPORAL%>   Temp Intravascular INTRAVASCULAR_1%> INTRAVASCULAR%>   Temp Axillary AXILLARY_1%> AXILLARY%>   Temp Rectal RECTAL_1%> RECTAL%>   02 Sat 98 % 98 %   Respiratory Rate RATE_1%>18 br/min RATE%>18 br/min   Peripheral Pulse Rate PULSE RATE_1%> PULSE RATE%>   Apical Heart Rate HEART RATE_1%> HEART RATE%>   Blood Pressure BLOOD PRESSURE_1%>/ BLOOD PRESSURE_1%>68 mmHg BLOOD PRESSURE%>145 mmHg / BLOOD PRESSURE%>68 mmHg              Immunizations      No Immunizations Documented This Visit       DISCHARGE INFORMATION   Discharge Disposition: H Outpt-Sent Home   Discharge Location:    Home   Discharge Date and Time:    01/20/2021 04:01:00   ED Checkout Date and Time:    01/20/2021 04:01:00     DEPART REASON INCOMPLETE INFORMATION               Depart Action Incomplete Reason   Interactive View/I&O Recently assessed               Problems      No Problems Documented              Smoking Status      No Smoking Status Documented         PATIENT EDUCATION INFORMATION  Instructions:       Urinary Tract Infection, Adult, Easy-to-Read     Follow up:                  With: Address: When:   Follow up with primary care  provider  Within 2 to 4 days           ED PROVIDER DOCUMENTATION     Patient:   Sandra Christensen, Sandra Christensen             MRN: [id number]            FIN: [phone]               Age:   75 years     Sex:  Female     DOB:  Nov 08, 1945   Associated Diagnoses:   UTI (urinary tract infection)   Author:   Angus Palms RICHARD-MD      Basic Information   Time seen: Provider Seen (ST)   ED Provider/Time:    Angus Palms RICHARD-MD / 01/20/2021 03:22  .   Additional information: Chief Complaint from Nursing Triage Note   Chief Complaint  Chief Complaint: pt c/o dysuria and pelvic pain/pressure ongoing for two weeks, but was awakened this AM with substantially increased pain (01/20/21 03:34:00).      History of Present Illness   The patient presents with dysuria.  The onset was 2  days ago.  The course/duration of symptoms is constant.  The character of symptoms is burning and foul smelling.  The degree at onset was moderate.  The degree at present is moderate.  The exacerbating factor is none.  The relieving factor is none.  Risk factors consist of none.        Review of Systems   Constitutional symptoms:  Negative except as documented in HPI.   Skin symptoms:  Negative except as documented in HPI.   Eye symptoms:  Negative except as documented in HPI.   ENMT symptoms:  Negative except as documented in HPI.   Respiratory symptoms:  Negative except as documented in  HPI.   Cardiovascular symptoms:  Negative except as documented in HPI.   Neurologic symptoms:  Negative except as documented in HPI.   Psychiatric symptoms:  Negative except as documented in HPI.   Endocrine symptoms:  Negative except as documented in HPI.   Hematologic/Lymphatic symptoms:  Negative except as documented in HPI.   Allergy/immunologic symptoms:  Negative except as documented in HPI.             Additional review of systems information: All other systems reviewed and otherwise negative.      Health Status   Allergies:    No inactive allergies have been recorded..    Medications:  (Selected)   Documented Medications  Documented  ALPRAZolam 0.25 mg oral tablet: 0.25 mg, 1 tabs, Oral, TID, PRN: for anxiety, 0 Refill(s)  traMADol 50 mg oral tablet: 50 mg, 1 tabs, Oral, q6hr, PRN, 30 EA, 1 Refill(s).      Past Medical/ Family/ Social History   Surgical history: Reviewed as documented in chart.   Family history: Reviewed as documented in chart.   Social history: Reviewed as documented in chart.   Problem list:    Active Problems (2)  Anxiety   High cholesterol   .      Physical Examination               Vital Signs   Vital Signs   01/20/2021 3:34 EDT Systolic Blood Pressure 145 mmHg  HI    Diastolic Blood Pressure 68 mmHg    Temperature Oral 36.5 degC    Heart Rate Monitored 70 bpm    Respiratory Rate 18 br/min    SpO2 98 %   .   General:  Alert, no acute distress.    Skin:  Warm, dry.    Gastrointestinal:  Soft, Nontender, Non distended, Normal bowel sounds, No organomegaly.    Back:  Nontender, Normal range of motion, Normal alignment.    Neurological:  Alert and oriented to person, place, time, and situation, No focal neurological deficit observed.    Lymphatics:  No lymphadenopathy.      Medical Decision Making   Differential Diagnosis:  Urinary tract infection, cystitis.    Results review:  Lab results : Lab View   01/20/2021 3:27 EDT UA Color Orange    UA Appear Clear    UA Glucose 100 mg/dL    UA Bili Moderate    UA Ketones Trace    UA Spec Grav 1.025    UA Blood Small    UA pH 5.0    Protein U 100    UA Urobilinogen >=8.0 EU/dL    UA Nitrite Positive    UA Leuk Est Moderate    WBC U TNTC /HPF    RBC U 0-2 /HPF    Sq Epi U Rare /LPF    Bacteria U Moderate    Mucus U Rare /LPF    Amorph U Not Seen /HPF   .      Impression and Plan   Diagnosis   UTI (urinary tract infection) (ICD10-CM N39.0, Discharge, Medical)   Plan   Condition: Improved.    Disposition: Discharged: to home.

## 2021-01-20 NOTE — ED Notes (Signed)
ED Patient Education Note     Patient Education Materials Follows:  Obstetrics and Gynecology     Urinary Tract Infection, Adult      A urinary tract infection (UTI) is an infection of any part of the urinary tract. The urinary tract includes:   The kidneys.     The ureters.     The bladder.     The urethra.      These organs make, store, and get rid of pee (urine) in the body.      What are the causes?    This infection is caused by germs (bacteria) in your genital area. These germs grow and cause swelling (inflammation) of your urinary tract.      What increases the risk?    The following factors may make you more likely to develop this condition:   Using a small, thin tube (catheter) to drain pee.     Not being able to control when you pee or poop (incontinence).     Being female. If you are female, these things can increase the risk:  ? Using these methods to prevent pregnancy:  ? A medicine that kills sperm (spermicide).    ? A device that blocks sperm (diaphragm).      ? Having low levels of a female hormone (estrogen).    ? Being pregnant.        You are more likely to develop this condition if:   You have genes that add to your risk.     You are sexually active.     You take antibiotic medicines.      You have trouble peeing because of:  ? A prostate that is bigger than normal, if you are female.    ? A blockage in the part of your body that drains pee from the bladder.    ? A kidney stone.     ? A nerve condition that affects your bladder.    ? Not getting enough to drink.     ? Not peeing often enough.       You have other conditions, such as:  ? Diabetes.     ? A weak disease-fighting system (immune system).    ? Sickle cell disease.     ? Gout.     ? Injury of the spine.          What are the signs or symptoms?    Symptoms of this condition include:   Needing to pee right away.     Peeing small amounts often.     Pain or burning when peeing.     Blood in the pee.     Pee that smells bad or not like  normal.     Trouble peeing.     Pee that is cloudy.     Fluid coming from the vagina, if you are female.     Pain in the belly or lower back.      Other symptoms include:   Vomiting.     Not feeling hungry.     Feeling mixed up (confused). This may be the first symptom in older adults.     Being tired and grouchy (irritable).     A fever.     Watery poop (diarrhea).        How is this treated?     Taking antibiotic medicine.     Taking other medicines.     Drinking enough water.      In some cases, you may need to see a specialist.      Follow these instructions at home:      Medicines     Take over-the-counter and prescription medicines only as told by your doctor.     If you were prescribed an antibiotic medicine, take it as told by your doctor. Do not stop taking it even if you start to feel better.      General instructions     Make sure you:  ? Pee until your bladder is empty.     ? Do not hold pee for a long time.    ? Empty your bladder after sex.    ? Wipe from front to back after peeing or pooping if you are a female. Use each tissue one time when you wipe.       Drink enough fluid to keep your pee pale yellow.     Keep all follow-up visits.        Contact a doctor if:     You do not get better after 1?2 days.     Your symptoms go away and then come back.      Get help right away if:     You have very bad back pain.     You have very bad pain in your lower belly.     You have a fever.     You have chills.     You feeling like you will vomit or you vomit.      Summary     A urinary tract infection (UTI) is an infection of any part of the urinary tract.     This condition is caused by germs in your genital area.     There are many risk factors for a UTI.     Treatment includes antibiotic medicines.     Drink enough fluid to keep your pee pale yellow.      This information is not intended to replace advice given to you by your health care provider. Make sure you discuss any questions you have with your health  care provider.      Document Revised: 10/28/2019 Document Reviewed: 10/28/2019  Elsevier Patient Education ? 2021 Elsevier Inc[employer]

## 2021-01-20 NOTE — ED Notes (Signed)
ED Triage Note       ED Triage Adult Entered On:  01/20/2021 3:41 EDT    Performed On:  01/20/2021 3:34 EDT by Chauncey Reading A-RN               Triage   Numeric Rating Pain Scale :   10 = Worst possible pain   Chief Complaint :   pt c/o dysuria and pelvic pain/pressure ongoing for two weeks, but was awakened this AM with substantially increased pain   [place] Mode of Arrival :   Private vehicle   Infectious Disease Documentation :   Document assessment   Temperature Oral :   36.5 degC(Converted to: 97.7 degF)    Heart Rate Monitored :   70 bpm   Respiratory Rate :   18 br/min   Systolic Blood Pressure :   145 mmHg (HI)    Diastolic Blood Pressure :   68 mmHg   SpO2 :   98 %   Oxygen Therapy :   Room air   Patient presentation :   None of the above   Chief Complaint or Presentation suggest infection :   No   Dosing Weight Obtained By :   Measured   Weight Dosing :   91.95 kg(Converted to: 202 lb 11 oz)    Height :   170 cm(Converted to: 5 ft 7 in)    Body Mass Index Dosing :   32 kg/m2   Chauncey Reading A-RN - 01/20/2021 3:34 EDT   Chauncey Reading A-RN - 01/20/2021 3:34 EDT   DCP GENERIC CODE   Tracking Group :   [facility] [place] [facility]   Chauncey Reading A-RN - 01/20/2021 3:34 EDT   Tracking Acuity :   4   Chauncey Reading A-RN - 01/20/2021 5:50 EDT     ED General Section :   Document assessment   Pregnancy Status :   N/A   ED Allergies Section :   Document assessment   ED Reason for Visit Section :   Document assessment   ED Quick Assessment :   Patient appears awake, alert, oriented to baseline. Skin warm and dry. Moves all extremities. Respiration even and unlabored. Appears in no apparent distress.   Chauncey Reading A-RN - 01/20/2021 3:34 EDT   ID Risk Screen Symptoms   Recent Travel History :   No recent travel   TB Symptom Screen :   No symptoms   Last 90 days COVID-19 ID :   No   Close Contact with COVID-19 ID :   No   Last 14 days COVID-19 ID :   No   C. diff  Symptom/History ID :   Neither of the above   Chauncey Reading A-RN - 01/20/2021 3:34 EDT   Allergies   (As Of: 01/20/2021 03:41:49 EDT)   Allergies (Active)   Levaquin  Estimated Onset Date:   Unspecified ; Reactions:   sick ; Created By:   Samuel Bouche, RN, LENNA R; Reaction Status:   Active ; Category:   Drug ; Substance:   Levaquin ; Type:   Allergy ; Severity:   Severe ; Updated By:   Samuel Bouche RN, Fatima Blank; Reviewed Date:   01/20/2021 3:35 EDT        Psycho-Social   Last 3 mo, thoughts killing self/others :   Patient denies   Right click within box for Suspected Abuse policy link. :   None  Feels Safe Where Live :   Yes   ED Behavioral Activity Rating Scale :   4 - Quiet and awake (normal level of activity)   Chauncey Reading A-RN - 01/20/2021 3:34 EDT   ED Reason for Visit   (As Of: 01/20/2021 03:41:49 EDT)   Problems(Active)    Anxiety (SNOMED CT  :01027253 )  Name of Problem:   Anxiety ; Recorder:   Chauncey Reading A-RN; Confirmation:   Confirmed ; Classification:   Patient Stated ; Code:   [id number] ; Contributor System:   PowerChart ; Last Updated:   01/20/2021 3:40 EDT ; Life Cycle Date:   01/20/2021 ; Life Cycle Status:   Active ; Vocabulary:   SNOMED CT        High cholesterol (SNOMED CT  :42595638 )  Name of Problem:   High cholesterol ; Recorder:   Chauncey Reading A-RN; Confirmation:   Confirmed ; Classification:   Patient Stated ; Code:   [id number] ; Contributor System:   Dietitian ; Last Updated:   01/20/2021 3:40 EDT ; Life Cycle Date:   01/20/2021 ; Life Cycle Status:   Active ; Vocabulary:   SNOMED CT          Diagnoses(Active)    Dysuria  Date:   01/20/2021 ; Diagnosis Type:   Reason For Visit ; Confirmation:   Complaint of ; Clinical Dx:   Dysuria ; Classification:   Medical ; Clinical Service:   Emergency medicine ; Code:   PNED ; Probability:   0 ; Diagnosis Code:   [id number]

## 2021-01-20 NOTE — ED Provider Notes (Signed)
Dysuria        Patient:   Sandra Christensen, Sandra Christensen             MRN: [id number]            FIN: [phone]               Age:   75 years     Sex:  Female     DOB:  [birth year]   Associated Diagnoses:   UTI (urinary tract infection)   Author:   Angus Palms RICHARD-MD      Basic Information   Time seen: Provider Seen (ST)   ED Provider/Time:    Angus Palms RICHARD-MD / 01/20/2021 03:22  .   Additional information: Chief Complaint from Nursing Triage Note   Chief Complaint  Chief Complaint: pt c/o dysuria and pelvic pain/pressure ongoing for two weeks, but was awakened this AM with substantially increased pain (01/20/21 03:34:00).      History of Present Illness   The patient presents with dysuria.  The onset was 2  days ago.  The course/duration of symptoms is constant.  The character of symptoms is burning and foul smelling.  The degree at onset was moderate.  The degree at present is moderate.  The exacerbating factor is none.  The relieving factor is none.  Risk factors consist of none.        Review of Systems   Constitutional symptoms:  Negative except as documented in HPI.   Skin symptoms:  Negative except as documented in HPI.   Eye symptoms:  Negative except as documented in HPI.   ENMT symptoms:  Negative except as documented in HPI.   Respiratory symptoms:  Negative except as documented in HPI.   Cardiovascular symptoms:  Negative except as documented in HPI.   Neurologic symptoms:  Negative except as documented in HPI.   Psychiatric symptoms:  Negative except as documented in HPI.   Endocrine symptoms:  Negative except as documented in HPI.   Hematologic/Lymphatic symptoms:  Negative except as documented in HPI.   Allergy/immunologic symptoms:  Negative except as documented in HPI.             Additional review of systems information: All other systems reviewed and otherwise negative.      Health Status   Allergies:    No inactive allergies have been recorded..   Medications:  (Selected)   Documented  Medications  Documented  ALPRAZolam 0.25 mg oral tablet: 0.25 mg, 1 tabs, Oral, TID, PRN: for anxiety, 0 Refill(s)  traMADol 50 mg oral tablet: 50 mg, 1 tabs, Oral, q6hr, PRN, 30 EA, 1 Refill(s).      Past Medical/ Family/ Social History   Surgical history: Reviewed as documented in chart.   Family history: Reviewed as documented in chart.   Social history: Reviewed as documented in chart.   Problem list:    Active Problems (2)  Anxiety   High cholesterol   .      Physical Examination               Vital Signs   Vital Signs   01/20/2021 3:34 EDT Systolic Blood Pressure 145 mmHg  HI    Diastolic Blood Pressure 68 mmHg    Temperature Oral 36.5 degC    Heart Rate Monitored 70 bpm    Respiratory Rate 18 br/min    SpO2 98 %   .   General:  Alert, no acute distress.    Skin:  Warm, dry.    Gastrointestinal:  Soft, Nontender, Non distended, Normal bowel sounds, No organomegaly.    Back:  Nontender, Normal range of motion, Normal alignment.    Neurological:  Alert and oriented to person, place, time, and situation, No focal neurological deficit observed.    Lymphatics:  No lymphadenopathy.      Medical Decision Making   Differential Diagnosis:  Urinary tract infection, cystitis.    Results review:  Lab results : Lab View   01/20/2021 3:27 EDT UA Color Orange    UA Appear Clear    UA Glucose 100 mg/dL    UA Bili Moderate    UA Ketones Trace    UA Spec Grav 1.025    UA Blood Small    UA pH 5.0    Protein U 100    UA Urobilinogen >=8.0 EU/dL    UA Nitrite Positive    UA Leuk Est Moderate    WBC U TNTC /HPF    RBC U 0-2 /HPF    Sq Epi U Rare /LPF    Bacteria U Moderate    Mucus U Rare /LPF    Amorph U Not Seen /HPF   .      Impression and Plan   Diagnosis   UTI (urinary tract infection) (ICD10-CM N39.0, Discharge, Medical)   Plan   Condition: Improved.    Disposition: Discharged: to home.      Signature Line     Electronically Signed on 01/20/2021 03:49 AM EDT   ________________________________________________   Angus Palms  RICHARD-MD

## 2021-01-21 LAB — CULTURE, URINE: FINAL REPORT: >100000

## 2021-01-24 NOTE — ED Notes (Signed)
 ED Results Callback Log     Urine Culture  Collected: 01/20/2021 EcESBL  Complete Body site:   Specimen Type: U CleanCatch 01/22/2021 09:57      01/24/2021 08:29 Dru, RN, Roberta L) No further action required Called pt, left v/m to return call for test results. Needs test results and rx. (see MD notes). A letter will be sent if no return call in 24 hours        01/24/2021 07:42 (DORRITY,  MICHAEL THOMAS-MD) Communication Looks like Augmentin should work. Please call in 500mg  BID x 7 days & have her f/u with her PCP. Thx, MD        01/22/2021 10:10 Albin, RN, Devere) Provider Review Required Positive urine ESBL, resistant to keflex which pt was prescribed. ALLERGY LEVAQUIN. Unknown creatinine level          Addendum by Belvie RN, Sandrea CROME on January 25, 2021 11:29 EDT               Returned pts call, informed of test results and change in rx. (see MD notes), Contacted [facility] [phone], rx prescribed ordered.   pt understands to stop Keflex and start Augmentin.     Addendum by Belvie RN, Sandrea CROME on January 25, 2021 11:33 EDT               correction, rx called to [facility] store [phone].SABRA

## 2024-03-10 ENCOUNTER — Ambulatory Visit: Admit: 2024-03-10 | Payer: MEDICARE

## 2024-03-10 ENCOUNTER — Inpatient Hospital Stay: Admit: 2024-03-10 | Payer: MEDICARE

## 2024-03-10 ENCOUNTER — Ambulatory Visit: Admit: 2024-03-10 | Discharge: 2024-03-10 | Payer: MEDICARE | Attending: Orthopaedic Surgery

## 2024-03-10 DIAGNOSIS — M25561 Pain in right knee: Principal | ICD-10-CM

## 2024-03-10 LAB — BASIC METABOLIC PANEL
Anion Gap: 12 mmol/L (ref 3–18)
BUN/Creatinine Ratio: 18
BUN: 15 mg/dL (ref 6–23)
CO2: 24 mmol/L (ref 21–32)
Calcium: 9.1 mg/dL (ref 8.6–10.2)
Chloride: 104 mmol/L (ref 98–107)
Creatinine: 0.84 mg/dL (ref 0.60–1.30)
Est, Glom Filt Rate: 72 ml/min/1.73m2
Glucose: 118 mg/dL — ABNORMAL HIGH (ref 74–108)
Potassium: 4.2 mmol/L (ref 3.5–5.5)
Sodium: 140 mmol/L (ref 136–145)

## 2024-03-10 LAB — CBC
Hematocrit: 37.4 % (ref 35.0–45.0)
Hemoglobin: 12.4 g/dL (ref 12.0–16.0)
MCH: 31.5 pg (ref 24.0–34.0)
MCHC: 33.2 g/dL (ref 31.0–37.0)
MCV: 94.9 FL (ref 78.0–100.0)
MPV: 11.8 FL (ref 9.2–11.8)
Nucleated RBCs: 0 /100{WBCs}
Platelets: 200 K/uL (ref 135–420)
RBC: 3.94 M/uL — ABNORMAL LOW (ref 4.20–5.30)
RDW: 13.3 % (ref 11.6–14.5)
WBC: 5.5 K/uL (ref 4.6–13.2)
nRBC: 0 K/uL (ref 0.00–0.01)

## 2024-03-10 NOTE — Progress Notes (Signed)
 "    Name: Sandra Christensen    DOB: [birth year]     [facility] [place] [facility]  [employer] [facility]  [address], [place]  [place] [address]  Dept: [phone]  Dept Fax: [phone]   Chief Complaint   Patient presents with    Knee Pain     Ht 1.676 m (5' 6)   Wt 90.3 kg (199 lb)   BMI 32.12 kg/m    Allergies   Allergen Reactions    Levofloxacin Angioedema, Nausea And Vomiting, Nausea Only, Hives and Swelling     Event:     No current outpatient medications on file.     No current facility-administered medications for this visit.       There is no problem list on file for this patient.     Family History   Problem Relation Age of Onset    Cancer Brother 24 - 66       Past Surgical History:   Procedure Laterality Date    KNEE SURGERY      WRIST FRACTURE SURGERY        History reviewed. No pertinent past medical history.         I have reviewed and agree with PFSH and ROS and intake form in chart and the record furthermore I have reviewed prior medical record(s) regarding this patients care during this appointment.     Review of Systems:   Patient is a pleasant appearing individual, appropriately dressed, well hydrated, well nourished, who is alert, appropriately oriented for age, and in no acute distress with a normal gait and normal affect who does not appear to be in any significant pain.       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 Patient is not able to walk distances longer than 300 feet without stopping or more than 15 minutes for over 4 months now. Patient is also unable to do any squatting or kneeling which has impacted daily living activities.Due to the severe nature of patients osteoarthritis and limited knee function the patient is unable to complete a formal physical therapy program due to pain severity, this also would have no benefit for the patient with grade 4 Kellgren-Lawrence.    Of note: This encounter is not finalizing the scheduling of any type of surgery.  Rather further diagnostic workup and clearances/ancillary workup will be required prior to scheduling the surgery.  All those documents will be reviewed and then a final decision on a follow-up appointment will be made regarding proceeding with surgery.  Patient has been made aware.    Inpatient status: The patient has admitted to severe pain in the affected knee and due to such pain they are unable to complete activities of daily living at home and/or work on a regular basis where conservative treatments have failed. After extensive discussion with the patient, they have chosen to receive a total knee replacement with the expectation of inpatient  procedure. Their dependent functional status (i.e. lack of capable support and safety at home, pain management, comorbities, or difficulty ambulating with assistive walking devices) would deem them a candidate for an inpatient stay. The patient acknowledges and understand the plan.    The risks of surgery were explained to the patient which include but not limited to infection, nerve injury, artery injury, tendon injury, poor result, poor wound healing, unforeseen incidence, bleeding, infection, nerve damage, failure to improve, worsening of symptoms, morbidity, and mortality risks were explained. All questions were answered. Patient was told of no guarantees. Patient accepts all risks and benefits. A  consent for surgery will be documented and signed by the patient or a legal guardian. All questions were answered. The procedure was explained in detail.     The patient was counseled about the risks of contracting Covid-19 during their perioperative period and any recovery window from their procedure. The patient was made aware that contracting Covid-19 may worsen their prognosis for recovering from their procedure and lend to a higher morbidity and/or mortality risk. All material risks, benefits, and reasonable alternatives including postponing the procedure were discussed. The patient DOES wish to proceed with their procedure at this time.       Encounter Diagnoses   Name Primary?    Right knee pain, unspecified chronicity Yes    Osteoarthritis of right knee, unspecified osteoarthritis type     Atrial fibrillation, unspecified type (HCC)     BMI 32.0-32.9,adult       History of Present Illness  78 year old female presents for evaluation of right knee pain.    Reports discomfort in right knee. No cardiology consultation or history of thromboembolic events. Under primary care. Interested in cancellation list for earlier surgery and inquired about postoperative physical therapy duration.    Results  Imaging   - X-ray right knee 3 views: Severe osteoarthritis in right knee    Assessment & Plan  Right knee pain:  Radiographic evidence of severe osteoarthritis. Discussed Jiffy knee replacement benefits, including quicker rehabilitation due to muscle and tendon preservation. Surgery at [facility] with same-day discharge. Advised preoperative evaluation for cardiac and pulmonary stability. Postoperative pain management with non-narcotic analgesics, tramadol for breakthrough pain. Suggested weight loss and nutrition program to optimize BMI, which patient accepted. Physical therapy for 6 weeks, close to home.    As part of continued conservative pain management options the patient was advised to utilize  Tylenol or OTC NSAIDS as long as it is not medically contraindicated.   Return to Office:    Follow-up and Dispositions    Return for SCHEDULE FOR SURGERY.          Scribed by Newman DELENA Blanch, MD as dictated by Newman DELENA. Blanch, MD.  Documentation, performed by, True and Accepted Antonetta Clanton A. Blanch, MD    The patient (or guardian, if applicable) and other individuals in attendance with the patient were advised that Artificial Intelligence will be utilized during this visit to record, process the conversation to generate a clinical note, and support improvement of the AI technology. The patient (or guardian, if applicable) and other individuals in attendance at the appointment consented to the use of AI, including the recording.                "

## 2024-03-10 NOTE — Patient Instructions (Signed)
 Knee Arthritis: Care Instructions  Overview     Knee arthritis is a breakdown of the cartilage that cushions your knee joint. When the cartilage wears down, your bones rub against each other. This causes pain and stiffness. Knee arthritis tends to get worse with time.  Treatment for knee arthritis involves reducing pain, making the leg muscles stronger, and staying at a healthy body weight. The treatment usually does not improve the health of the cartilage, but it can reduce pain and improve how well your knee works.  You can take simple measures to protect your knee joints, ease your pain, and help you stay active.  Follow-up care is a key part of your treatment and safety. Be sure to make and go to all appointments, and call your doctor if you are having problems. It's also a good idea to know your test results and keep a list of the medicines you take.  How can you care for yourself at home?  Know that knee arthritis will cause more pain on some days than on others.  Stay at a healthy weight. Lose weight if you are overweight. When you stand up, the pressure on your knees from every pound of body weight is multiplied four times. So if you lose 10 pounds, you will reduce the pressure on your knees by 40 pounds.  Talk to your doctor or physical therapist about exercises that will help ease joint pain.  Stretch to help prevent stiffness and to prevent injury before you exercise. You may enjoy gentle forms of yoga to help keep your knee joints and muscles flexible.  Walk instead of jog.  Ride a bike. This makes your thigh muscles stronger and takes pressure off your knee.  Wear well-fitting and comfortable shoes.  Exercise in chest-deep water. This can help you exercise longer with less pain.  Avoid exercises that include squatting or kneeling. They can put a lot of strain on your knees.  Talk to your doctor to make sure that the exercise you do is not making the arthritis worse.  Do not sit for long periods of time.  Try to walk once in a while to keep your knee from getting stiff.  Ask your doctor or physical therapist whether shoe inserts may reduce your arthritis pain.  If you can afford it, get new athletic shoes at least every year. This can help reduce the strain on your knees.  Use a device to help you do everyday activities.  A cane or walking stick can help you keep your balance when you walk. Hold the cane or walking stick in the hand opposite the painful knee.  If you feel like you may fall when you walk, try using crutches or a front-wheeled walker. These can prevent falls that could cause more damage to your knee.  A knee brace may help keep your knee stable and prevent pain.  You also can use other things to make life easier, such as a higher toilet seat and handrails in the bathtub or shower.  Take pain medicines exactly as directed.  Do not wait until you are in severe pain. You will get better results if you take it sooner.  If you are not taking a prescription pain medicine, take an over-the-counter medicine such as acetaminophen (Tylenol), ibuprofen (Advil, Motrin), or naproxen (Aleve). Read and follow all instructions on the label.  Do not take two or more pain medicines at the same time unless the doctor told you to. Many pain  medicines have acetaminophen, which is Tylenol. Too much acetaminophen (Tylenol) can be harmful.  Tell your doctor if you take a blood thinner, have diabetes, or have allergies to shellfish.  Ask your doctor if you might benefit from a shot of steroid medicine into your knee. This may provide pain relief for several months.  Many people take the supplements glucosamine and chondroitin for osteoarthritis. Some people feel they help, but the medical research does not show that they work. Talk to your doctor before you take these supplements.  When should you call for help?   Call your doctor now or seek immediate medical care if:    You have sudden swelling, warmth, or pain in your knee.      You have knee pain and a fever or rash.     You have such bad pain that you cannot use your knee.   Watch closely for changes in your health, and be sure to contact your doctor if you have any problems.  Where can you learn more?  Go to [employer] and enter W187 to learn more about "Knee Arthritis: Care Instructions."  Current as of: June 06, 2020               Content Version: 13.5   2006-2022 [employer].   Care instructions adapted under license by [facility]. If you have questions about a medical condition or this instruction, always ask your healthcare professional. [employer] disclaims any warranty or liability for your use of this information.       Knee Arthritis: Exercises  Introduction  Here are some examples of exercises for you to try. The exercises may be suggested for a condition or for rehabilitation. Start each exercise slowly. Ease off the exercises if you start to have pain.  You will be told when to start these exercises and which ones will work best for you.  How to do the exercises  Heel slide (ankles crossed)    Lie on your back with your knees bent.  Slide your heel back by bending your affected knee as far as you can. Then hook your other foot around your ankle to help pull your heel even farther back.  Hold for about 6 seconds.  Return to your starting position.  Repeat 8 to 12 times.  If you can, repeat these steps for your other knee.  Quad set    Sit or lie down on a firm surface or the floor with your affected leg straight. Place a small, rolled-up towel under your knee.  Tighten the thigh muscles of your straight leg by pressing the back of your knee down into the towel.  Hold for about 6 seconds, then rest.  Repeat 8 to 12 times.  It's a good idea to repeat these steps with your other leg.  Hip flexion (lying down, leg straight)    Lie on your back with your affected leg straight. You can bend your other leg, if that feels  more comfortable.  Tighten the thigh muscles in your affected leg by pressing the back of your knee down. Hold your knee straight.  Keeping the thigh muscles tight and your leg straight, lift your affected leg up so that your heel is about 12 inches off the floor. Hold for about 6 seconds, then lower slowly.  Repeat 8 to 12 times.  It's a good idea to repeat these steps with your other leg.  Active knee flexion    Lie on your  stomach with your knees straight. If your kneecap is uncomfortable, roll up a washcloth and put it under your leg just above your kneecap.  Lift the foot of your affected leg by bending the knee so that you bring the foot up toward your buttock. If this motion hurts, try it without bending your knee quite as far. This may help you avoid any painful motion.  Slowly move your leg up and down.  Repeat 8 to 12 times.  Switch legs and repeat steps 1 through 4, even if only one knee is sore.  Quadriceps stretch (facedown)    Lie flat on your stomach, and rest your face on the floor.  Wrap a towel or belt strap around the lower part of your affected leg. Then use the towel or belt strap to slowly pull your heel toward your buttock until you feel a stretch.  Hold for about 15 to 30 seconds, then relax your leg against the towel or belt strap.  Repeat 2 to 4 times.  Switch legs and repeat steps 1 through 4, even if only one knee is sore.  Stationary exercise bike    If you do not have a stationary exercise bike at home, you can find one to ride at your local health club or community center.  Adjust the height of the bike seat so that your knee is slightly bent when your leg is extended downward. If your knee hurts when the pedal reaches the top, you can raise the seat so that your knee does not bend as much.  Start slowly. At first, try to do 5 to 10 minutes of cycling with little to no resistance. Then increase your time and the resistance bit by bit until you can do 20 to 30 minutes without pain.  If  you start to have pain, rest your knee until your pain gets back to the level that is normal for you. Or cycle for less time or with less effort.  Follow-up care is a key part of your treatment and safety. Be sure to make and go to all appointments, and call your doctor if you are having problems. It's also a good idea to know your test results and keep a list of the medicines you take.  Current as of: October 15, 2021               Content Version: 13.9   2006-2023 [employer].   Care instructions adapted under license by [facility]. If you have questions about a medical condition or this instruction, always ask your healthcare professional. [employer] disclaims any warranty or liability for your use of this information.

## 2024-03-11 LAB — EKG 12-LEAD
Atrial Rate: 72 {beats}/min
P Axis: 63 degrees
P-R Interval: 172 ms
Q-T Interval: 400 ms
QRS Duration: 70 ms
QTc Calculation (Bazett): 438 ms
R Axis: 14 degrees
T Axis: 86 degrees
Ventricular Rate: 72 {beats}/min

## 2024-03-12 LAB — CULTURE, MRSA, SCREENING

## 2024-04-12 ENCOUNTER — Encounter

## 2024-04-26 ENCOUNTER — Encounter

## 2024-04-28 ENCOUNTER — Telehealth: Admit: 2024-04-28 | Discharge: 2024-04-28 | Payer: MEDICARE | Attending: Orthopaedic Surgery

## 2024-04-28 DIAGNOSIS — M1711 Unilateral primary osteoarthritis, right knee: Principal | ICD-10-CM

## 2024-04-28 MED ORDER — DOCUSATE SODIUM 100 MG PO CAPS
100 | ORAL_CAPSULE | Freq: Two times a day (BID) | ORAL | 0 refills | Status: AC | PRN
Start: 2024-04-28 — End: 2024-05-05

## 2024-04-28 MED ORDER — MELOXICAM 15 MG PO TABS
15 | ORAL_TABLET | Freq: Every evening | ORAL | 0 refills | Status: AC
Start: 2024-04-28 — End: 2024-05-28

## 2024-04-28 MED ORDER — ONDANSETRON 4 MG PO TBDP
4 | ORAL_TABLET | Freq: Three times a day (TID) | ORAL | 0 refills | Status: AC | PRN
Start: 2024-04-28 — End: ?

## 2024-04-28 MED ORDER — CEPHALEXIN 500 MG PO CAPS
500 | ORAL_CAPSULE | Freq: Three times a day (TID) | ORAL | 0 refills | Status: AC
Start: 2024-04-28 — End: 2024-05-05

## 2024-04-28 MED ORDER — DEXAMETHASONE 4 MG PO TABS
4 | ORAL_TABLET | Freq: Two times a day (BID) | ORAL | 0 refills | Status: AC
Start: 2024-04-28 — End: 2024-05-02

## 2024-04-28 MED ORDER — GABAPENTIN 300 MG PO CAPS
300 | ORAL_CAPSULE | Freq: Every evening | ORAL | 0 refills | Status: AC
Start: 2024-04-28 — End: 2024-05-28

## 2024-04-28 MED ORDER — SUZETRIGINE 50 MG PO TABS
50 | ORAL_TABLET | Freq: Two times a day (BID) | ORAL | 0 refills | Status: AC
Start: 2024-04-28 — End: 2024-05-13

## 2024-04-28 MED ORDER — ASPIRIN 81 MG PO TBEC
81 | ORAL_TABLET | Freq: Two times a day (BID) | ORAL | 0 refills | Status: AC
Start: 2024-04-28 — End: 2024-05-28

## 2024-04-28 NOTE — Patient Instructions (Signed)
 Knee Arthritis: Care Instructions  Overview     Knee arthritis is a breakdown of the cartilage that cushions your knee joint. When the cartilage wears down, your bones rub against each other. This causes pain and stiffness. Knee arthritis tends to get worse with time.  Treatment for knee arthritis involves reducing pain, making the leg muscles stronger, and staying at a healthy body weight. The treatment usually does not improve the health of the cartilage, but it can reduce pain and improve how well your knee works.  You can take simple measures to protect your knee joints, ease your pain, and help you stay active.  Follow-up care is a key part of your treatment and safety. Be sure to make and go to all appointments, and call your doctor if you are having problems. It's also a good idea to know your test results and keep a list of the medicines you take.  How can you care for yourself at home?  Know that knee arthritis will cause more pain on some days than on others.  Stay at a healthy weight. Lose weight if you are overweight. When you stand up, the pressure on your knees from every pound of body weight is multiplied four times. So if you lose 10 pounds, you will reduce the pressure on your knees by 40 pounds.  Talk to your doctor or physical therapist about exercises that will help ease joint pain.  Stretch to help prevent stiffness and to prevent injury before you exercise. You may enjoy gentle forms of yoga to help keep your knee joints and muscles flexible.  Walk instead of jog.  Ride a bike. This makes your thigh muscles stronger and takes pressure off your knee.  Wear well-fitting and comfortable shoes.  Exercise in chest-deep water. This can help you exercise longer with less pain.  Avoid exercises that include squatting or kneeling. They can put a lot of strain on your knees.  Talk to your doctor to make sure that the exercise you do is not making the arthritis worse.  Do not sit for long periods of time.  Try to walk once in a while to keep your knee from getting stiff.  Ask your doctor or physical therapist whether shoe inserts may reduce your arthritis pain.  If you can afford it, get new athletic shoes at least every year. This can help reduce the strain on your knees.  Use a device to help you do everyday activities.  A cane or walking stick can help you keep your balance when you walk. Hold the cane or walking stick in the hand opposite the painful knee.  If you feel like you may fall when you walk, try using crutches or a front-wheeled walker. These can prevent falls that could cause more damage to your knee.  A knee brace may help keep your knee stable and prevent pain.  You also can use other things to make life easier, such as a higher toilet seat and handrails in the bathtub or shower.  Take pain medicines exactly as directed.  Do not wait until you are in severe pain. You will get better results if you take it sooner.  If you are not taking a prescription pain medicine, take an over-the-counter medicine such as acetaminophen (Tylenol), ibuprofen (Advil, Motrin), or naproxen (Aleve). Read and follow all instructions on the label.  Do not take two or more pain medicines at the same time unless the doctor told you to. Many pain  medicines have acetaminophen, which is Tylenol. Too much acetaminophen (Tylenol) can be harmful.  Tell your doctor if you take a blood thinner, have diabetes, or have allergies to shellfish.  Ask your doctor if you might benefit from a shot of steroid medicine into your knee. This may provide pain relief for several months.  Many people take the supplements glucosamine and chondroitin for osteoarthritis. Some people feel they help, but the medical research does not show that they work. Talk to your doctor before you take these supplements.  When should you call for help?   Call your doctor now or seek immediate medical care if:    You have sudden swelling, warmth, or pain in your knee.      You have knee pain and a fever or rash.     You have such bad pain that you cannot use your knee.   Watch closely for changes in your health, and be sure to contact your doctor if you have any problems.  Where can you learn more?  Go to [employer] and enter W187 to learn more about "Knee Arthritis: Care Instructions."  Current as of: June 06, 2020               Content Version: 13.5   2006-2022 [employer].   Care instructions adapted under license by [facility]. If you have questions about a medical condition or this instruction, always ask your healthcare professional. [employer] disclaims any warranty or liability for your use of this information.       Knee Arthritis: Exercises  Introduction  Here are some examples of exercises for you to try. The exercises may be suggested for a condition or for rehabilitation. Start each exercise slowly. Ease off the exercises if you start to have pain.  You will be told when to start these exercises and which ones will work best for you.  How to do the exercises  Heel slide (ankles crossed)    Lie on your back with your knees bent.  Slide your heel back by bending your affected knee as far as you can. Then hook your other foot around your ankle to help pull your heel even farther back.  Hold for about 6 seconds.  Return to your starting position.  Repeat 8 to 12 times.  If you can, repeat these steps for your other knee.  Quad set    Sit or lie down on a firm surface or the floor with your affected leg straight. Place a small, rolled-up towel under your knee.  Tighten the thigh muscles of your straight leg by pressing the back of your knee down into the towel.  Hold for about 6 seconds, then rest.  Repeat 8 to 12 times.  It's a good idea to repeat these steps with your other leg.  Hip flexion (lying down, leg straight)    Lie on your back with your affected leg straight. You can bend your other leg, if that feels  more comfortable.  Tighten the thigh muscles in your affected leg by pressing the back of your knee down. Hold your knee straight.  Keeping the thigh muscles tight and your leg straight, lift your affected leg up so that your heel is about 12 inches off the floor. Hold for about 6 seconds, then lower slowly.  Repeat 8 to 12 times.  It's a good idea to repeat these steps with your other leg.  Active knee flexion    Lie on your  stomach with your knees straight. If your kneecap is uncomfortable, roll up a washcloth and put it under your leg just above your kneecap.  Lift the foot of your affected leg by bending the knee so that you bring the foot up toward your buttock. If this motion hurts, try it without bending your knee quite as far. This may help you avoid any painful motion.  Slowly move your leg up and down.  Repeat 8 to 12 times.  Switch legs and repeat steps 1 through 4, even if only one knee is sore.  Quadriceps stretch (facedown)    Lie flat on your stomach, and rest your face on the floor.  Wrap a towel or belt strap around the lower part of your affected leg. Then use the towel or belt strap to slowly pull your heel toward your buttock until you feel a stretch.  Hold for about 15 to 30 seconds, then relax your leg against the towel or belt strap.  Repeat 2 to 4 times.  Switch legs and repeat steps 1 through 4, even if only one knee is sore.  Stationary exercise bike    If you do not have a stationary exercise bike at home, you can find one to ride at your local health club or community center.  Adjust the height of the bike seat so that your knee is slightly bent when your leg is extended downward. If your knee hurts when the pedal reaches the top, you can raise the seat so that your knee does not bend as much.  Start slowly. At first, try to do 5 to 10 minutes of cycling with little to no resistance. Then increase your time and the resistance bit by bit until you can do 20 to 30 minutes without pain.  If  you start to have pain, rest your knee until your pain gets back to the level that is normal for you. Or cycle for less time or with less effort.  Follow-up care is a key part of your treatment and safety. Be sure to make and go to all appointments, and call your doctor if you are having problems. It's also a good idea to know your test results and keep a list of the medicines you take.  Current as of: October 15, 2021               Content Version: 13.9   2006-2023 [employer].   Care instructions adapted under license by [facility]. If you have questions about a medical condition or this instruction, always ask your healthcare professional. [employer] disclaims any warranty or liability for your use of this information.

## 2024-05-02 ENCOUNTER — Encounter: Payer: MEDICARE | Attending: Orthopaedic Surgery

## 2024-05-09 ENCOUNTER — Encounter: Payer: MEDICARE | Attending: Orthopaedic Surgery
